# Patient Record
Sex: Female | Born: 1970 | Race: White | Hispanic: No | Marital: Married | Smoking: Former smoker
Health system: Southern US, Community
[De-identification: ages and names within clinical notes are randomized; demographics above are authoritative.]

## PROBLEM LIST (undated history)

## (undated) DIAGNOSIS — G43701 Chronic migraine without aura, not intractable, with status migrainosus: Secondary | ICD-10-CM

## (undated) HISTORY — DX: Chronic migraine without aura, not intractable, with status migrainosus: G43.701

## (undated) HISTORY — PX: TUBAL LIGATION: SHX77

---

## 1997-05-29 ENCOUNTER — Other Ambulatory Visit: Admission: RE | Admit: 1997-05-29 | Discharge: 1997-05-29 | Payer: Self-pay | Admitting: Obstetrics and Gynecology

## 1999-02-12 ENCOUNTER — Observation Stay (HOSPITAL_COMMUNITY): Admission: EM | Admit: 1999-02-12 | Discharge: 1999-02-13 | Payer: Self-pay | Admitting: Internal Medicine

## 1999-08-31 ENCOUNTER — Other Ambulatory Visit: Admission: RE | Admit: 1999-08-31 | Discharge: 1999-08-31 | Payer: Self-pay | Admitting: Obstetrics and Gynecology

## 2000-03-23 ENCOUNTER — Other Ambulatory Visit: Admission: RE | Admit: 2000-03-23 | Discharge: 2000-03-23 | Payer: Self-pay | Admitting: Obstetrics and Gynecology

## 2000-03-25 ENCOUNTER — Emergency Department (HOSPITAL_COMMUNITY): Admission: EM | Admit: 2000-03-25 | Discharge: 2000-03-25 | Payer: Self-pay | Admitting: Emergency Medicine

## 2000-04-03 ENCOUNTER — Emergency Department (HOSPITAL_COMMUNITY): Admission: EM | Admit: 2000-04-03 | Discharge: 2000-04-03 | Payer: Self-pay | Admitting: Emergency Medicine

## 2001-03-27 ENCOUNTER — Other Ambulatory Visit: Admission: RE | Admit: 2001-03-27 | Discharge: 2001-03-27 | Payer: Self-pay | Admitting: Obstetrics and Gynecology

## 2002-05-31 ENCOUNTER — Ambulatory Visit (HOSPITAL_COMMUNITY): Admission: RE | Admit: 2002-05-31 | Discharge: 2002-05-31 | Payer: Self-pay | Admitting: Obstetrics and Gynecology

## 2004-08-16 ENCOUNTER — Ambulatory Visit: Payer: Self-pay | Admitting: Internal Medicine

## 2011-11-07 ENCOUNTER — Other Ambulatory Visit (HOSPITAL_BASED_OUTPATIENT_CLINIC_OR_DEPARTMENT_OTHER): Payer: Self-pay | Admitting: Obstetrics and Gynecology

## 2011-11-07 DIAGNOSIS — Z1231 Encounter for screening mammogram for malignant neoplasm of breast: Secondary | ICD-10-CM

## 2011-11-09 ENCOUNTER — Ambulatory Visit (HOSPITAL_BASED_OUTPATIENT_CLINIC_OR_DEPARTMENT_OTHER)
Admission: RE | Admit: 2011-11-09 | Discharge: 2011-11-09 | Disposition: A | Discharge status: Home / Self Care | Payer: Private Health Insurance - Indemnity | Source: Ambulatory Visit | Attending: Obstetrics and Gynecology | Admitting: Obstetrics and Gynecology

## 2011-11-09 DIAGNOSIS — R922 Inconclusive mammogram: Secondary | ICD-10-CM | POA: Insufficient documentation

## 2011-11-09 DIAGNOSIS — Z1231 Encounter for screening mammogram for malignant neoplasm of breast: Secondary | ICD-10-CM | POA: Insufficient documentation

## 2011-11-14 ENCOUNTER — Other Ambulatory Visit: Payer: Self-pay | Admitting: Obstetrics and Gynecology

## 2011-11-14 DIAGNOSIS — R928 Other abnormal and inconclusive findings on diagnostic imaging of breast: Secondary | ICD-10-CM

## 2011-11-15 ENCOUNTER — Ambulatory Visit
Admission: RE | Admit: 2011-11-15 | Discharge: 2011-11-15 | Disposition: A | Discharge status: Home / Self Care | Payer: Private Health Insurance - Indemnity | Source: Ambulatory Visit | Attending: Obstetrics and Gynecology | Admitting: Obstetrics and Gynecology

## 2011-11-15 DIAGNOSIS — R928 Other abnormal and inconclusive findings on diagnostic imaging of breast: Secondary | ICD-10-CM

## 2016-11-15 ENCOUNTER — Ambulatory Visit (INDEPENDENT_AMBULATORY_CARE_PROVIDER_SITE_OTHER): Payer: Managed Care, Other (non HMO) | Admitting: Neurology

## 2016-11-15 ENCOUNTER — Encounter (INDEPENDENT_AMBULATORY_CARE_PROVIDER_SITE_OTHER): Payer: Self-pay

## 2016-11-15 ENCOUNTER — Encounter: Payer: Self-pay | Admitting: Neurology

## 2016-11-15 VITALS — BP 96/53 | HR 68 | Ht 64.0 in | Wt 128.0 lb

## 2016-11-15 DIAGNOSIS — M542 Cervicalgia: Secondary | ICD-10-CM

## 2016-11-15 DIAGNOSIS — G43701 Chronic migraine without aura, not intractable, with status migrainosus: Secondary | ICD-10-CM | POA: Diagnosis not present

## 2016-11-15 DIAGNOSIS — H539 Unspecified visual disturbance: Secondary | ICD-10-CM

## 2016-11-15 DIAGNOSIS — R51 Headache with orthostatic component, not elsewhere classified: Secondary | ICD-10-CM

## 2016-11-15 DIAGNOSIS — R29818 Other symptoms and signs involving the nervous system: Secondary | ICD-10-CM | POA: Diagnosis not present

## 2016-11-15 DIAGNOSIS — R519 Headache, unspecified: Secondary | ICD-10-CM

## 2016-11-15 DIAGNOSIS — G4483 Primary cough headache: Secondary | ICD-10-CM

## 2016-11-15 DIAGNOSIS — R29898 Other symptoms and signs involving the musculoskeletal system: Secondary | ICD-10-CM | POA: Diagnosis not present

## 2016-11-15 DIAGNOSIS — S161XXA Strain of muscle, fascia and tendon at neck level, initial encounter: Secondary | ICD-10-CM

## 2016-11-15 DIAGNOSIS — G43009 Migraine without aura, not intractable, without status migrainosus: Secondary | ICD-10-CM | POA: Insufficient documentation

## 2016-11-15 MED ORDER — TOPIRAMATE 25 MG PO TABS: 50.0000 mg | ORAL_TABLET | Freq: Two times a day (BID) | ORAL | 6 refills | Status: DC

## 2016-11-15 MED ORDER — TIZANIDINE HCL 4 MG PO CAPS: 4.0000 mg | ORAL_CAPSULE | Freq: Every evening | ORAL | 11 refills | Status: DC | PRN

## 2016-11-15 MED ORDER — DICLOFENAC POTASSIUM(MIGRAINE) 50 MG PO PACK: PACK | ORAL | 11 refills | Status: DC

## 2016-11-15 MED ORDER — SUMATRIPTAN SUCCINATE 100 MG PO TABS: ORAL_TABLET | ORAL | 11 refills | Status: DC

## 2016-11-15 NOTE — Patient Instructions (Signed)
Remember to drink plenty of fluid, eat healthy meals and do not skip any meals. Try to eat protein with a every meal and eat a healthy snack such as fruit or nuts in between meals. Try to keep a regular sleep-wake schedule and try to exercise daily, particularly in the form of walking, 20-30 minutes a day, if you can.   As far as your medications are concerned, I would like to suggest:  Start Topiramate, Tizanidine at bedtime for neck spasms, With the imitrex take Cambia PT for neck spasms and dry needling Talk to OBGYn about birth control which may help As far as diagnostic testing: MRI brain and cervical spine  I would like to see you back in 4 months, sooner if we need to. Please call us with any interim questions, concerns, problems, updates or refill requests.   Our phone number is [phone]. We also have an after hours call service for urgent matters and there is a physician on-call for urgent questions. For any emergencies you know to call 911 or go to the nearest emergency room  Topiramate tablets What is this medicine? TOPIRAMATE (toe PYRE a mate) is used to treat seizures in adults or children with epilepsy. It is also used for the prevention of migraine headaches. This medicine may be used for other purposes; ask your health care provider or pharmacist if you have questions. COMMON BRAND NAME(S): Topamax, Topiragen What should I tell my health care provider before I take this medicine? They need to know if you have any of these conditions: -bleeding disorders -cirrhosis of the liver or liver disease -diarrhea -glaucoma -kidney stones or kidney disease -low blood counts, like low white cell, platelet, or red cell counts -lung disease like asthma, obstructive pulmonary disease, emphysema -metabolic acidosis -on a ketogenic diet -schedule for surgery or a procedure -suicidal thoughts, plans, or attempt; a previous suicide attempt by you or a family member -an unusual or  allergic reaction to topiramate, other medicines, foods, dyes, or preservatives -pregnant or trying to get pregnant -breast-feeding How should I use this medicine? Take this medicine by mouth with a glass of water. Follow the directions on the prescription label. Do not crush or chew. You may take this medicine with meals. Take your medicine at regular intervals. Do not take it more often than directed. Talk to your pediatrician regarding the use of this medicine in children. Special care may be needed. While this drug may be prescribed for children as young as 53 years of age for selected conditions, precautions do apply. Overdosage: If you think you have taken too much of this medicine contact a poison control center or emergency room at once. NOTE: This medicine is only for you. Do not share this medicine with others. What if I miss a dose? If you miss a dose, take it as soon as you can. If your next dose is to be taken in less than 6 hours, then do not take the missed dose. Take the next dose at your regular time. Do not take double or extra doses. What may interact with this medicine? Do not take this medicine with any of the following medications: -probenecid This medicine may also interact with the following medications: -acetazolamide -alcohol -amitriptyline -aspirin and aspirin-like medicines -birth control pills -certain medicines for depression -certain medicines for seizures -certain medicines that treat or prevent blood clots like warfarin, enoxaparin, dalteparin, apixaban, dabigatran, and rivaroxaban -digoxin -hydrochlorothiazide -lithium -medicines for pain, sleep, or muscle relaxation -metformin -methazolamide -  NSAIDS, medicines for pain and inflammation, like ibuprofen or naproxen -pioglitazone -risperidone This list may not describe all possible interactions. Give your health care provider a list of all the medicines, herbs, non-prescription drugs, or dietary  supplements you use. Also tell them if you smoke, drink alcohol, or use illegal drugs. Some items may interact with your medicine. What should I watch for while using this medicine? Visit your doctor or health care professional for regular checks on your progress. Do not stop taking this medicine suddenly. This increases the risk of seizures if you are using this medicine to control epilepsy. Wear a medical identification bracelet or chain to say you have epilepsy or seizures, and carry a card that lists all your medicines. This medicine can decrease sweating and increase your body temperature. Watch for signs of deceased sweating or fever, especially in children. Avoid extreme heat, hot baths, and saunas. Be careful about exercising, especially in hot weather. Contact your health care provider right away if you notice a fever or decrease in sweating. You should drink plenty of fluids while taking this medicine. If you have had kidney stones in the past, this will help to reduce your chances of forming kidney stones. If you have stomach pain, with nausea or vomiting and yellowing of your eyes or skin, call your doctor immediately. You may get drowsy, dizzy, or have blurred vision. Do not drive, use machinery, or do anything that needs mental alertness until you know how this medicine affects you. To reduce dizziness, do not sit or stand up quickly, especially if you are an older patient. Alcohol can increase drowsiness and dizziness. Avoid alcoholic drinks. If you notice blurred vision, eye pain, or other eye problems, seek medical attention at once for an eye exam. The use of this medicine may increase the chance of suicidal thoughts or actions. Pay special attention to how you are responding while on this medicine. Any worsening of mood, or thoughts of suicide or dying should be reported to your health care professional right away. This medicine may increase the chance of developing metabolic acidosis. If  left untreated, this can cause kidney stones, bone disease, or slowed growth in children. Symptoms include breathing fast, fatigue, loss of appetite, irregular heartbeat, or loss of consciousness. Call your doctor immediately if you experience any of these side effects. Also, tell your doctor about any surgery you plan on having while taking this medicine since this may increase your risk for metabolic acidosis. Birth control pills may not work properly while you are taking this medicine. Talk to your doctor about using an extra method of birth control. Women who become pregnant while using this medicine may enroll in the [place] American Antiepileptic Drug Pregnancy Registry by calling [phone]. This registry collects information about the safety of antiepileptic drug use during pregnancy. What side effects may I notice from receiving this medicine? Side effects that you should report to your doctor or health care professional as soon as possible: -allergic reactions like skin rash, itching or hives, swelling of the face, lips, or tongue -decreased sweating and/or rise in body temperature -depression -difficulty breathing, fast or irregular breathing patterns -difficulty speaking -difficulty walking or controlling muscle movements -hearing impairment -redness, blistering, peeling or loosening of the skin, including inside the mouth -tingling, pain or numbness in the hands or feet -unusual bleeding or bruising -unusually weak or tired -worsening of mood, thoughts or actions of suicide or dying Side effects that usually do not require medical attention (report  to your doctor or health care professional if they continue or are bothersome): -altered taste -back pain, joint or muscle aches and pains -diarrhea, or constipation -headache -loss of appetite -nausea -stomach upset, indigestion -tremors This list may not describe all possible side effects. Call your doctor for medical advice  about side effects. You may report side effects to FDA at 1-800-FDA-1088. Where should I keep my medicine? Keep out of the reach of children. Store at room temperature between 15 and 30 degrees C (59 and 86 degrees F) in a tightly closed container. Protect from moisture. Throw away any unused medicine after the expiration date. NOTE: This sheet is a summary. It may not cover all possible information. If you have questions about this medicine, talk to your doctor, pharmacist, or health care provider.  2018 Elsevier/Gold Standard (2013-02-18 23:17:57)  Diclofenac powder for oral solution What is this medicine? DICLOFENAC (dye KLOE fen ak) is a non-steroidal anti-inflammatory drug (NSAID). It is used to treat migraine pain. This medicine may be used for other purposes; ask your health care provider or pharmacist if you have questions. COMMON BRAND NAME(S): Cambia What should I tell my health care provider before I take this medicine? They need to know if you have any of these conditions: -asthma, especially aspirin sensitive asthma -coronary artery bypass graft (CABG) surgery within the past 2 weeks -drink more than 3 alcohol-containing drinks a day -heart disease or circulation problems like heart failure or leg edema (fluid retention) -high blood pressure -kidney disease -liver disease -phenylketonuria -stomach problems -an unusual or allergic reaction to diclofenac, aspirin, other NSAIDs, other medicines, foods, dyes, or preservatives -pregnant or trying to get pregnant -breast-feeding How should I use this medicine? Mix this medicine with 1 to 2 ounces of water. Drink the medicine and water together. Follow the directions on the prescription label. Do not take your medicine more often than directed. Long-term, continuous use may increase the risk of heart attack or stroke. A special MedGuide will be given to you by the pharmacist with each prescription and refill. Be sure to read this  information carefully each time. Talk to your pediatrician regarding the use of this medicine in children. Special care may be needed. Elderly patients over 48 years old may have a stronger reaction and need a smaller dose. Overdosage: If you think you have taken too much of this medicine contact a poison control center or emergency room at once. NOTE: This medicine is only for you. Do not share this medicine with others. What if I miss a dose? This does not apply. What may interact with this medicine? Do not take this medicine with any of the following medications: -cidofovir -ketorolac -methotrexate This medicine may also interact with the following medications: -alcohol -aspirin and aspirin-like medicines -cyclosporine -diuretics -lithium -medicines for blood pressure -medicines for osteoporosis -medicines that affect platelets -medicines that treat or prevent blood clots like warfarin -NSAIDs, medicines for pain and inflammation, like ibuprofen or naproxen -pemetrexed -steroid medicines like prednisone or cortisone This list may not describe all possible interactions. Give your health care provider a list of all the medicines, herbs, non-prescription drugs, or dietary supplements you use. Also tell them if you smoke, drink alcohol, or use illegal drugs. Some items may interact with your medicine. What should I watch for while using this medicine? Tell your doctor or health care professional if your pain does not get better. Talk to your doctor before taking another medicine for pain. Do not treat yourself.  This medicine does not prevent heart attack or stroke. In fact, this medicine may increase the chance of a heart attack or stroke. The chance may increase with longer use of this medicine and in people who have heart disease. If you take aspirin to prevent heart attack or stroke, talk with your doctor or health care professional. Do not take medicines such as ibuprofen and naproxen  with this medicine. Side effects such as stomach upset, nausea, or ulcers may be more likely to occur. Many medicines available without a prescription should not be taken with this medicine. This medicine can cause ulcers and bleeding in the stomach and intestines at any time during treatment. Do not smoke cigarettes or drink alcohol. These increase irritation to your stomach and can make it more susceptible to damage from this medicine. Ulcers and bleeding can happen without warning symptoms and can cause death. You may get drowsy or dizzy. Do not drive, use machinery, or do anything that needs mental alertness until you know how this medicine affects you. Do not stand or sit up quickly, especially if you are an older patient. This reduces the risk of dizzy or fainting spells. This medicine can cause you to bleed more easily. Try to avoid damage to your teeth and gums when you brush or floss your teeth. If you take migraine medicines for 10 or more days a month, your migraines may get worse. Keep a diary of headache days and medicine use. Contact your healthcare professional if your migraine attacks occur more frequently. What side effects may I notice from receiving this medicine? Side effects that you should report to your doctor or health care professional as soon as possible: -allergic reactions like skin rash, itching or hives, swelling of the face, lips, or tongue -black or bloody stools, blood in the urine or vomit -blurred vision -chest pain -difficulty breathing or wheezing -nausea or vomiting -fever -redness, blistering, peeling or loosening of the skin, including inside the mouth -slurred speech or weakness on one side of the body -trouble passing urine or change in the amount of urine -unexplained weight gain or swelling -unusually weak or tired -yellowing of eyes or skin Side effects that usually do not require medical attention (report to your doctor or health care professional if  they continue or are bothersome): -constipation -diarrhea -dizziness -headache -heartburn This list may not describe all possible side effects. Call your doctor for medical advice about side effects. You may report side effects to FDA at 1-800-FDA-1088. Where should I keep my medicine? Keep out of the reach of children. Store at room temperature between 15 and 30 degrees C (59 and 86 degrees F). Throw away any unused medicine after the expiration date. NOTE: This sheet is a summary. It may not cover all possible information. If you have questions about this medicine, talk to your doctor, pharmacist, or health care provider.  2018 Elsevier/Gold Standard (2015-03-19 09:56:49)

## 2016-11-15 NOTE — Progress Notes (Signed)
[facility]    Provider:  Dr Lucia Gaskins Referring Provider: Richardean Chimera, MD Primary Care Physician:  Richardean Chimera, MD  CC:  Migraines and neck pain  HPI:  Carol Mckinney is a 46 y.o. female here as a referral from Dr. Arelia Sneddon for migraines and neck pain. Started years ago and sporadic, once every 6 months. In the last year she has a cluster, lasts 4-5 days. Maybe around periods, unclear because her periods are not regular may may premenopausal. She has 16 headache days a month and 8 headache days a month that are migraines. Every month cluster of 4-5 days of refractory migraines. Migraines are on the left, the most uncomfortable pain, skin sensitivity around the left eye, pulsating, unilateral, light sensitivity, nausea. She takes imitrex but it take 90 minutes to work. Imitrex works, it takes time. No other OTC medications. No aura. No medication overuse. Worsening over the last year. The ramp up is slow. She has neck pain, started in June/July, stiffness, decreased ROM and the right arm hurts. She has muscle tightness in the cervical paraspinals. If she sneezes the headache worsens and the neck pain worsens. Headache is positional. Never had imaging of her brain ir neck. Imitrex makes her head feel heavy and foggy. >!5 headache days a month.No other focal neurologic deficits, associated symptoms, inciting events or modifiable factors.  Meds tried: Prozac, starting topiramate   Reviewed notes, labs and imaging from outside physicians, which showed:  Reviewed notes, her cycles are every 2-3 weeks and to 3 days of flow, not heavy, some distress and Aredia, sexual activity with no discomfort, previous bilateral tubal ligations. Has had problems with migraine headaches seem to cluster around her periods. Does have some mild stress incontinence. No change in bowel habits. May be perimenopausal anovulatory cycling. Exam was negative. Diagnosed with migraine headaches.  CMP normal  09/2014  Review of Systems: Patient complains of symptoms per HPI as well as the following symptoms: Headache, insomnia, snoring, joint pain. Pertinent negatives and positives per HPI. All others negative.   Social History   Social History  . Marital status: Married    Spouse name: N/A  . Number of children: N/A  . Years of education: N/A   Occupational History  . Not on file.   Social History Main Topics  . Smoking status: Current Every Day Smoker    Types: Cigarettes  . Smokeless tobacco: Never Used     Comment: 5 cig a day  . Alcohol use No  . Drug use: No  . Sexual activity: Not on file   Other Topics Concern  . Not on file   Social History Narrative  . No narrative on file    Family History  Problem Relation Age of Onset  . Migraines Mother     Past Medical History:  Diagnosis Date  . Chronic migraine without aura, with status migrainosus     Past Surgical History:  Procedure Laterality Date  . no surgical history      Current Outpatient Prescriptions  Medication Sig Dispense Refill  . SUMAtriptan (IMITREX) 100 MG tablet take one tablet at the onset of your headache. If it does not improve the symptoms take one additional tablet in 2 hours. Max 2 in 24hrs. 10 tablet 11  . Diclofenac Potassium 50 MG PACK Take 50 mg by mouth once as needed for headache onset. May take with imitrex. 9 each 11  . tiZANidine (ZANAFLEX) 4 MG capsule Take 1 capsule (4 mg total)  by mouth at bedtime as needed for muscle spasms. 30 capsule 11  . topiramate (TOPAMAX) 25 MG tablet Take 2 tablets (50 mg total) by mouth 2 (two) times daily. 60 tablet 6   No current facility-administered medications for this visit.     Allergies as of 11/15/2016  . (Not on File)    Vitals: BP (!) 96/53   Pulse 68   Ht  (1.626 m)   Wt 128 lb (58.1 kg)   BMI 21.97 kg/m  Last Weight:  Wt Readings from Last 1 Encounters:  11/15/16 128 lb (58.1 kg)   Last Height:   Ht Readings from Last  1 Encounters:  11/15/16  (1.626 m)    Physical exam: Exam: Gen: NAD, conversant, well nourised, obese, well groomed                     CV: RRR, no MRG. No Carotid Bruits. No peripheral edema, warm, nontender Eyes: Conjunctivae clear without exudates or hemorrhage  Neuro: Detailed Neurologic Exam  Speech:    Speech is normal; fluent and spontaneous with normal comprehension.  Cognition:    The patient is oriented to person, place, and time;     recent and remote memory intact;     language fluent;     normal attention, concentration,     fund of knowledge Cranial Nerves:    The pupils are equal, round, and reactive to light. The fundi are normal and spontaneous venous pulsations are present. Visual fields are full to finger confrontation. Extraocular movements are intact. Trigeminal sensation is intact and the muscles of mastication are normal. The face is symmetric. The palate elevates in the midline. Hearing intact. Voice is normal. Shoulder shrug is normal. The tongue has normal motion without fasciculations.   Coordination:    Normal finger to nose and heel to shin. Normal rapid alternating movements.   Gait:    Heel-toe and tandem gait are normal.   Motor Observation:    No asymmetry, no atrophy, and no involuntary movements noted. Tone:    Normal muscle tone.    Posture:    Posture is normal. normal erect    Strength: Mild right arm weakness, otherwise strength is V/V in the upper and lower limbs.      Sensation: intact to LT     Reflex Exam:  DTR's:    Deep tendon reflexes in the upper and lower extremities are normal bilaterally.   Toes:    The toes are downgoing bilaterally.   Clonus:    Clonus is absent.      Assessment/Plan:  46 year old patient with chronic migraines without aura. Had a very long discussion about possible migraine management including acute, chronic, preventative and nonpharmacologic methods.  Remember to drink plenty of fluid,  eat healthy meals and do not skip any meals. Try to eat protein with a every meal and eat a healthy snack such as fruit or nuts in between meals. Try to keep a regular sleep-wake schedule and try to exercise daily, particularly in the form of walking, 20-30 minutes a day, if you can.   As far as your medications are concerned, I would like to suggest:  For migraine preventative: Start Topiramate, discussed botox therapy and patient can contact me in the next 4 months, will titrate Topiramate and/or try another preventative but patient may be best on Botox therapy given her sensitivity to medications and failure several classes already. Muscle spasms Tizanidine at bedtime for  neck spasms,   Physical Therapy: Cervical myofascial pain, forward posture contributing to migraines and cervicalgia. Please evaluate and treat including dry needling, stretching, strengthening, manual therapy/massage, heating, TENS unit, exercising for scapular stabilization, pectoral stretching and rhomboid strengthening as clinically warranted as well as any other modality as recommended by evaluation.  Migraine acute management: With the imitrex take Cambia (has tried Tylenol, Advil, Aleve, diclofenac tab, Toradol)  Hormonal fluctuations of migraine: Talk to OBGYn about birth control which may help As far as diagnostic testing: MRI brain given worsening headaches, pain with cough or Valsalva to rule out space-occupying mass or lesion causing positional changes,   I would like to see you back in 4 months, sooner if we need to. Please call us with any interim questions, concerns, problems, updates or refill requests.   Orders Placed This Encounter  Procedures  . MR BRAIN W WO CONTRAST  . Basic Metabolic Panel  . Ambulatory referral to Physical Therapy    Discussed: To prevent or relieve headaches, try the following: Cool Compress. Lie down and place a cool compress on your head.  Avoid headache triggers. If certain  foods or odors seem to have triggered your migraines in the past, avoid them. A headache diary might help you identify triggers.  Include physical activity in your daily routine. Try a daily walk or other moderate aerobic exercise.  Manage stress. Find healthy ways to cope with the stressors, such as delegating tasks on your to-do list.  Practice relaxation techniques. Try deep breathing, yoga, massage and visualization.  Eat regularly. Eating regularly scheduled meals and maintaining a healthy diet might help prevent headaches. Also, drink plenty of fluids.  Follow a regular sleep schedule. Sleep deprivation might contribute to headaches Consider biofeedback. With this mind-body technique, you learn to control certain bodily functions - such as muscle tension, heart rate and blood pressure - to prevent headaches or reduce headache pain.    Proceed to emergency room if you experience new or worsening symptoms or symptoms do not resolve, if you have new neurologic symptoms or if headache is severe, or for any concerning symptom.   Provided education and documentation from American headache Society toolbox including articles on: chronic migraine medication overuse headache, chronic migraines, prevention of migraines, behavioral and other nonpharmacologic treatments for headache.    Naomie Dean, MD  [facility] [address] [place], [place] [address]  Phone [phone] Fax [phone]

## 2016-11-22 ENCOUNTER — Ambulatory Visit (INDEPENDENT_AMBULATORY_CARE_PROVIDER_SITE_OTHER): Payer: Managed Care, Other (non HMO)

## 2016-11-22 DIAGNOSIS — R29818 Other symptoms and signs involving the nervous system: Secondary | ICD-10-CM

## 2016-11-22 DIAGNOSIS — H539 Unspecified visual disturbance: Secondary | ICD-10-CM

## 2016-11-22 DIAGNOSIS — R519 Headache, unspecified: Secondary | ICD-10-CM

## 2016-11-22 DIAGNOSIS — G4483 Primary cough headache: Secondary | ICD-10-CM | POA: Diagnosis not present

## 2016-11-22 DIAGNOSIS — R29898 Other symptoms and signs involving the musculoskeletal system: Secondary | ICD-10-CM

## 2016-11-22 DIAGNOSIS — R51 Headache with orthostatic component, not elsewhere classified: Secondary | ICD-10-CM

## 2016-11-22 MED ORDER — GADOPENTETATE DIMEGLUMINE 469.01 MG/ML IV SOLN: 11.0000 mL | Freq: Once | INTRAVENOUS | Status: AC | PRN

## 2016-11-25 ENCOUNTER — Ambulatory Visit: Payer: Managed Care, Other (non HMO) | Admitting: Physical Therapy

## 2016-11-29 ENCOUNTER — Ambulatory Visit: Payer: Managed Care, Other (non HMO) | Admitting: Physical Therapy

## 2017-03-20 ENCOUNTER — Encounter: Payer: Self-pay | Admitting: Neurology

## 2017-03-20 ENCOUNTER — Encounter (INDEPENDENT_AMBULATORY_CARE_PROVIDER_SITE_OTHER): Payer: Self-pay

## 2017-03-20 ENCOUNTER — Ambulatory Visit: Payer: Managed Care, Other (non HMO) | Admitting: Neurology

## 2017-03-20 VITALS — BP 94/57 | HR 68 | Ht 64.0 in | Wt 130.0 lb

## 2017-03-20 DIAGNOSIS — G43709 Chronic migraine without aura, not intractable, without status migrainosus: Secondary | ICD-10-CM

## 2017-03-20 MED ORDER — FLUOXETINE HCL 20 MG PO CAPS: 20.0000 mg | ORAL_CAPSULE | Freq: Every day | ORAL | 4 refills | Status: DC

## 2017-03-20 NOTE — Progress Notes (Addendum)
[facility]    Provider:  Dr Lucia Gaskins Referring Provider: Richardean Chimera, MD Primary Care Physician:  Richardean Chimera, MD  CC:  Migraines and neck pain  Interval history: Migraines are worsening.  Topiramate samples had side effects.  Ws on prozac in the past.  B-blockers contraindicated.  No medication overuse and no aura.  She has 20 headache days a month and 15 her migraines this is been ongoing for over 4 months.  She has been on multiple medications in the past including Prozac, we gave her multiple samples last time of topiramate and patient had significant cognitive impairment, will restart Prozac at this time, beta-blockers are contraindicated due to very low blood pressure.  Recommend Botox at this time. Migraines can last up to 24 hours and be severe. Cambia made her throw up.  HPI:  Carol Mckinney is a 47 y.o. female here as a referral from Dr. Arelia Sneddon for migraines and neck pain. Started years ago and sporadic, once every 6 months. In the last year she has a cluster, lasts 4-5 days. Maybe around periods, unclear because her periods are not regular may may premenopausal. She has 16 headache days a month and 8 headache days a month that are migraines. Every month cluster of 4-5 days of refractory migraines. Migraines are on the left, the most uncomfortable pain, skin sensitivity around the left eye, pulsating, unilateral, light sensitivity, nausea. She takes imitrex but it take 90 minutes to work. Imitrex works, it takes time. No other OTC medications. No aura. No medication overuse. Worsening over the last year. The ramp up is slow. She has neck pain, started in June/July, stiffness, decreased ROM and the right arm hurts. She has muscle tightness in the cervical paraspinals. If she sneezes the headache worsens and the neck pain worsens. Headache is positional. Never had imaging of her brain ir neck. Imitrex makes her head feel heavy and foggy. >!5 headache days a month.No other  focal neurologic deficits, associated symptoms, inciting events or modifiable factors.  Meds tried: Prozac, topiramate(side effects), Maxalt (horrible), Imitrex (works but makes her groggy), prozac, b-blockers contraindicated due to low BP  Reviewed notes, labs and imaging from outside physicians, which showed:  Reviewed notes, her cycles are every 2-3 weeks and to 3 days of flow, not heavy, some distress and Aredia, sexual activity with no discomfort, previous bilateral tubal ligations. Has had problems with migraine headaches seem to cluster around her periods. Does have some mild stress incontinence. No change in bowel habits. May be perimenopausal anovulatory cycling. Exam was negative. Diagnosed with migraine headaches.  CMP normal 09/2014   Social History   Socioeconomic History  . Marital status: Married    Spouse name: Not on file  . Number of children: 2  . Years of education: Not on file  . Highest education level: Some college, no degree  Social Needs  . Financial resource strain: Not on file  . Food insecurity - worry: Not on file  . Food insecurity - inability: Not on file  . Transportation needs - medical: Not on file  . Transportation needs - non-medical: Not on file  Occupational History  . Occupation: Land    Comment: [employer]  Tobacco Use  . Smoking status: Former Smoker    Types: Cigarettes    Last attempt to quit: 02/28/2017    Years since quitting: 0.0  . Smokeless tobacco: Never Used  . Tobacco comment: 5 cig a day  Substance and Sexual Activity  .  Alcohol use: No  . Drug use: No  . Sexual activity: Not on file  Other Topics Concern  . Not on file  Social History Narrative   Lives at home with husband and daughters   Right handed   Drinks no caffeine daily    Family History  Problem Relation Age of Onset  . Migraines Mother     Past Medical History:  Diagnosis Date  . Chronic migraine without aura, with status  migrainosus     Past Surgical History:  Procedure Laterality Date  . no surgical history      Current Outpatient Medications  Medication Sig Dispense Refill  . Diclofenac Potassium 50 MG PACK Take 50 mg by mouth once as needed for headache onset. May take with imitrex. 9 each 11  . SUMAtriptan (IMITREX) 100 MG tablet take one tablet at the onset of your headache. If it does not improve the symptoms take one additional tablet in 2 hours. Max 2 in 24hrs. 10 tablet 11  . tiZANidine (ZANAFLEX) 4 MG capsule Take 1 capsule (4 mg total) by mouth at bedtime as needed for muscle spasms. 30 capsule 11  . topiramate (TOPAMAX) 25 MG tablet Take 2 tablets (50 mg total) by mouth 2 (two) times daily. 60 tablet 6  . FLUoxetine (PROZAC) 20 MG capsule Take 1 capsule (20 mg total) by mouth daily. 90 capsule 4   No current facility-administered medications for this visit.    Facility-Administered Medications Ordered in Other Visits  Medication Dose Route Frequency Provider Last Rate Last Dose  . gadopentetate dimeglumine (MAGNEVIST) injection 11 mL  11 mL Intravenous Once PRN Anson Fret, MD        Allergies as of 03/20/2017  . (Not on File)    Vitals: BP (!) 94/57 (BP Location: Right Arm, Patient Position: Sitting)   Pulse 68   Ht 5\' 4"  (1.626 m)   Wt 130 lb (59 kg)   BMI 22.31 kg/m  Last Weight:  Wt Readings from Last 1 Encounters:  03/20/17 130 lb (59 kg)   Last Height:   Ht Readings from Last 1 Encounters:  03/20/17 5\' 4"  (1.626 m)    Physical exam: Exam: Gen: NAD, conversant, well nourised, well groomed                      Neuro: Detailed Neurologic Exam  Speech:    Speech is normal; fluent and spontaneous with normal comprehension.  Cognition:    The patient is oriented to person, place, and time;     recent and remote memory intact;     language fluent;     normal attention, concentration,     fund of knowledge Cranial Nerves:    The pupils are equal, round, and  reactive to light. The fundi are normal and spontaneous venous pulsations are present. Visual fields are full to finger confrontation. Extraocular movements are intact. Trigeminal sensation is intact and the muscles of mastication are normal. The face is symmetric. The palate elevates in the midline. Hearing intact. Voice is normal. Shoulder shrug is normal. The tongue has normal motion without fasciculations.   Coordination:    Normal finger to nose and heel to shin. Normal rapid alternating movements.   Gait:    Heel-toe and tandem gait are normal.   Motor Observation:    No asymmetry, no atrophy, and no involuntary movements noted. Tone:    Normal muscle tone.    Posture:  Posture is normal. normal erect    Strength:    Strength is V/V in the upper and lower limbs.      Sensation: intact to LT     Reflex Exam:    Assessment/Plan:  47 year old patient with chronic migraines without aura for at least one year, worsening, failed multiple classes medications. No medication overuse and no aura.  She has 20 headache days a month and 15 are migraines this is been ongoing for over 4 months at this higher frequency.    - She has been on multiple medications in the past including Prozac, we gave her multiple samples last time of topiramate and patient had significant cognitive impairment, will restart Prozac at this time, beta-blockers are contraindicated due to very low blood pressure.  Recommend Botox at this time. Tried PT did not help.    Migraine acute management: With the imitrex (has tried Tylenol, Advil, Aleve, diclofenac tab, Toradol)  Hormonal fluctuations of migraine: Working with OBGY[facility] about birth control which may help  I would like to see you back in for botox   Naomie DeanAntonia Ahern, MD  [facility] [address] [place], [place] [id number]  Phone [phone] Fax [phone]  A total of 15 minutes was spent face-to-face with this  patient. Over half this time was spent on counseling patient on the chronic migraine diagnosis and different diagnostic and therapeutic options available.

## 2017-04-13 ENCOUNTER — Ambulatory Visit: Payer: Managed Care, Other (non HMO) | Admitting: Neurology

## 2017-04-24 ENCOUNTER — Telehealth: Payer: Self-pay | Admitting: Neurology

## 2017-04-24 NOTE — Telephone Encounter (Signed)
Patient called and relayed she received a letter in the mail from her insurance company relaying her Botox request is denied at this time.   I relayed to Patient Carol Mckinney is out of the office 04/24/2017.

## 2017-04-25 ENCOUNTER — Ambulatory Visit: Payer: Self-pay | Admitting: Neurology

## 2017-04-25 NOTE — Telephone Encounter (Signed)
I called and went over the denial with the patient. She did not get approved because she has not tried and failed enough drug classes. She said she is willing to try another medication in order to get the botox approved but would like to know what Dr. Lucia GaskinsAhern thinks she should do. Please call and advise.

## 2017-05-01 NOTE — Telephone Encounter (Signed)
Is there any way we can call insurance and see what medications she has to try first? Did they send a detailed denial letter? In the meantime we could offer he Ajovy or Aimovig.   Danielle, anymore details on what meds she needs to try?

## 2017-05-04 NOTE — Telephone Encounter (Signed)
She has to have tried and failed a beta blocker, calcium channel blocker, tricyclic antidepressant or anticonvulsant. She needs two of these drug classes for at least 3 months.

## 2017-05-08 NOTE — Telephone Encounter (Signed)
Toma CopierBethany, would you call and discuss with patient we need to try her on more medication before botox can be approved unfortunately. See if she is willing to try something else. She has to be on it for at least 3 months, again unfortunately.

## 2017-05-08 NOTE — Telephone Encounter (Signed)
Spoke with patient. She is fine with trying another medication. She is aware that she will need to try it least 3 months. Pt prefers one that doesn't cause weight gain.

## 2017-05-10 ENCOUNTER — Other Ambulatory Visit: Payer: Self-pay | Admitting: Neurology

## 2017-05-10 DIAGNOSIS — G43711 Chronic migraine without aura, intractable, with status migrainosus: Secondary | ICD-10-CM

## 2017-05-10 MED ORDER — ZONISAMIDE 50 MG PO CAPS: 50.0000 mg | ORAL_CAPSULE | Freq: Every day | ORAL | 6 refills | Status: DC

## 2017-05-10 NOTE — Telephone Encounter (Signed)
Called pt and discussed that Dr. Lucia GaskinsAhern has ordered Zonegran 50 mg daily at bedtime. Discussed that this is a seizure medication and s/e including but not limited to dizziness, sleepiness, agitation or irritability, depression (low percentage). Pt encouraged to consult full list of side effects and information that comes with medication at time of pick up. She verbalized appreciation and understanding and will update Dr. Lucia GaskinsAhern.

## 2017-05-10 NOTE — Telephone Encounter (Signed)
Let's try zonegran 50mg  at bedtime. Will call it in thanks

## 2017-06-12 ENCOUNTER — Telehealth: Payer: Self-pay | Admitting: Neurology

## 2017-06-12 NOTE — Telephone Encounter (Signed)
Patient calling to discuss dosage of topiramate (TOPAMAX) 25 MG tablet.

## 2017-06-12 NOTE — Telephone Encounter (Signed)
Pt has returned the call to RN Bethany, please call °

## 2017-06-12 NOTE — Telephone Encounter (Signed)
Spoke with pt. She started Qudexy 25 mg (was given samples in January at office visit) last week to try it since she was having a migraine for 6 days. She stated that she had picked up the Zonisamide but has not started it yet. She is not taking Topamax. She would like Dr. Trevor MaceAhern's advice on what to take. She will take Qudexy 25 mg again tonight while she waits for clarification. She stated she got confused on what to take.

## 2017-06-12 NOTE — Telephone Encounter (Signed)
Attempted to return pt's call. Phone rang, voicemail box was full.

## 2017-06-13 NOTE — Telephone Encounter (Addendum)
Spoke with the patient and discussed her medications. RN informed pt that Dr. Lucia GaskinsAhern was told by the patient in January that Topiramate gave her significant cognitive impairment. The patient clarified and said that she had never filled the Topiramate. She was worried about potential cognitive side effects and had heard "other people's horror stories" so she never took it. She never started the Zonegran either but she did fill it. One week ago she started the Qudexy XR 25 mg samples that she was given. She is taking it at night. So far, she has less energy and feels drained and doesn't even have the energy to go go to the gym so she is concerned about increasing the dose. She is open to doing whatever Dr. Lucia GaskinsAhern suggests.   Meds tried: Qudexy (side effects), Prozac, Maxalt (horrible), Imitrex (works but makes her groggy), b-blockers contraindicated due to low BP.   Spoke with Dr. Lucia GaskinsAhern. Due to side effects of Qudexy, will have pt stop this and start Zonegran as previously prescribed. RN will also d/c Topiramate from med list.  Spoke with the patient. Advised her that since she is having side effects on the Qudexy, Dr. Lucia GaskinsAhern will have her stop that and start Zonegran 50 mg at bedtime. Pt already has this med at home. Discussed that this is a seizure medication and s/e including but not limited to dizziness, sleepiness, agitation or irritability, depression (low percentage). Pt encouraged to consult full list of side effects and information that comes with medication Patient verbalized understanding and appreciation. She will not take the Qudexy tonight and will start the Zonegran tonight. She RN encouraged pt to call with any questions.

## 2017-06-13 NOTE — Telephone Encounter (Signed)
Patient reported that the Topiramate gave her significant cognitive impairment so she stopped it. So no she isn;t supposed to be taking that. We then started the actazolamide which is what she should be taking not the topiramate. Tell her to throw away the topiramate samples. thanks

## 2017-06-13 NOTE — Addendum Note (Signed)
Addended by: Bertram SavinULBERTSON, Adora Yeh L on: 06/13/2017 11:06 AM   Modules accepted: Orders

## 2017-08-07 ENCOUNTER — Telehealth: Payer: Self-pay | Admitting: Neurology

## 2017-08-07 NOTE — Telephone Encounter (Signed)
Infusion availability at 1:00 or 1:30. Ok per Dr. Lucia GaskinsAhern.   Called pt to discuss. Voicemail box not setup. Unable to reach pt.

## 2017-08-07 NOTE — Telephone Encounter (Signed)
Pt's had a migraine for the past 8 days. She is wanting to come in for an injection. Please call to advise

## 2017-08-07 NOTE — Telephone Encounter (Addendum)
Spoke with patient. She stated that she is ok right now. She wants to know her options if this comes back. She has had intermittent migraine for 8 out of 10 days. She has taken her Imitrex. Afraid she will end up running out. Has f/u appt on 6/27. Had to stop Zonegran d/t side effects: tired, ?lower BP, could hardly walk. Discussed that pt could call back if her h/a comes back and possibly get an injection this afternoon like Toradol. Also if new symptoms arise or this happens after hours, advise ED or urgent care. Pt verbalized understanding and appreciation.

## 2017-08-08 NOTE — Telephone Encounter (Signed)
Tried to call pt. No answer and vm box has not been setup yet.

## 2017-08-08 NOTE — Telephone Encounter (Signed)
I owuld like to start her on Aimovig 140mg (aslong as no latex allergy). We discussed this at last appointment, if she is willing please order 140mg  once monthly and we can push back her appointment to give it time. These are the new meds and they are great

## 2017-08-14 ENCOUNTER — Other Ambulatory Visit: Payer: Self-pay | Admitting: Physician Assistant

## 2017-08-14 DIAGNOSIS — R1013 Epigastric pain: Secondary | ICD-10-CM

## 2017-08-17 NOTE — Telephone Encounter (Signed)
Okay to start aimovig.

## 2017-08-17 NOTE — Telephone Encounter (Signed)
I called pt, explained Dr. Trevor MaceAhern's recommendations. She is agreeable to starting aimovig. She denies a latex allergy. She [facility] [place] Pt knows an RN that can assist her with the monthly injection. Pt asked that her appt on 08/24/17 be cancelled and pushed out farther. Appt made for 10/11/17 at 3:00pm. Pt verbalized understanding of new appt date and time and recommendations.

## 2017-08-18 MED ORDER — ERENUMAB-AOOE 140 MG/ML ~~LOC~~ SOAJ: 140.0000 mg | SUBCUTANEOUS | 0 refills | Status: DC

## 2017-08-18 NOTE — Addendum Note (Signed)
Addended by: Geronimo RunningINKINS, Cierrah Dace A on: 08/18/2017 07:47 AM   Modules accepted: Orders

## 2017-08-18 NOTE — Telephone Encounter (Signed)
Aimovig order placed.

## 2017-08-21 ENCOUNTER — Ambulatory Visit
Admission: RE | Admit: 2017-08-21 | Discharge: 2017-08-21 | Disposition: A | Discharge status: Home / Self Care | Payer: Managed Care, Other (non HMO) | Source: Ambulatory Visit | Attending: Physician Assistant | Admitting: Physician Assistant

## 2017-08-21 DIAGNOSIS — R1013 Epigastric pain: Secondary | ICD-10-CM

## 2017-08-22 ENCOUNTER — Telehealth: Payer: Self-pay | Admitting: *Deleted

## 2017-08-22 NOTE — Telephone Encounter (Signed)
Pt has called to inform that she checked with  [id number] [place], [place] - [address] [phone] (Phone) [phone] (Fax)     On today and was told that a Prior Authorization is needed.  Pt is asking for a call for the status re: her Erenumab-aooe (AIMOVIG) 140 MG/ML SOAJ Please call

## 2017-08-22 NOTE — Telephone Encounter (Signed)
Initiated PA aimovig on covermymeds. Key: [id number]. In process of completing.

## 2017-08-22 NOTE — Telephone Encounter (Signed)
PA approved effective 07/23/17-08/22/18. NFAOZH:[id number]:[id number].    Pt pharmacy info for 2019:  ID#: [id number] RxPCN: [id number], RxGROUP: [id number], RxBIN: [id number]. Pharmacy help desk: [phone].   Pt has tried/failed: prozac, tizanidine, cambia, sumatriptan, tylenol, advil, toradol, aleve, qudexy, maxalt, zonegran. Beta blockers contraindicated d/t low BP.  Dx: [id number].

## 2017-08-22 NOTE — Telephone Encounter (Signed)
Spoke with pt. Directed her to the website www.aimovigaccesscard.com for the copay card to get it free and can use this through the end of the year at least. Informed pt that she can go ahead and get started on Aimovig while we wait for PA to be done as it could take several days. Discussed that if she has constipation she may need a stool softener but please call with any problems. She verbalized understanding and appreciation.

## 2017-08-24 ENCOUNTER — Ambulatory Visit: Payer: Managed Care, Other (non HMO) | Admitting: Neurology

## 2017-10-11 ENCOUNTER — Ambulatory Visit: Payer: Managed Care, Other (non HMO) | Admitting: Neurology

## 2017-10-11 ENCOUNTER — Encounter: Payer: Self-pay | Admitting: Neurology

## 2017-10-11 VITALS — BP 105/65 | HR 59 | Ht 64.0 in | Wt 129.0 lb

## 2017-10-11 DIAGNOSIS — G43711 Chronic migraine without aura, intractable, with status migrainosus: Secondary | ICD-10-CM

## 2017-10-11 MED ORDER — GALCANEZUMAB-GNLM 120 MG/ML ~~LOC~~ SOAJ: 120.0000 mg | SUBCUTANEOUS | 0 refills | Status: DC

## 2017-10-11 NOTE — Progress Notes (Addendum)
[facility]    Provider:  Dr Lucia GaskinsAhern Referring Provider: Richardean ChimeraMcComb, John, MD Primary Care Physician:  Richardean ChimeraMcComb, John, MD  CC:  Migraines and neck pain  Interval history:  Migraines are worsening.  Topiramate samples had side effects.  Ws on prozac in the past.  B-blockers contraindicated.  No medication overuse and no aura.  She has 20 headache days a month and 15 her migraines this is been ongoing for over 6 months. Will try emgality and in 3 months try again for botox.  Migraines are severe or moderately severe. +photo/phonophobia, +nausea, pounding/throbbing, movement makes it worse, they can last 24-72 hours. Aimovig with constipation  Interval history: Migraines are worsening.  Topiramate samples had side effects.  Ws on prozac in the past.  B-blockers contraindicated.  No medication overuse and no aura.  She has 20 headache days a month and 15 her migraines this is been ongoing for over 4 months.  She has been on multiple medications in the past including Prozac, we gave her multiple samples last time of topiramate and patient had significant cognitive impairment, will restart Prozac at this time, beta-blockers are contraindicated due to very low blood pressure.  Recommend Botox at this time. Migraines can last up to 24 hours and be severe. Cambia made her throw up.  HPI:  Carol Mckinney is a 47 y.o. female here as a referral from Dr. Arelia SneddonMcComb for migraines and neck pain. Started years ago and sporadic, once every 6 months. In the last year she has a cluster, lasts 4-5 days. Maybe around periods, unclear because her periods are not regular may may premenopausal. She has 16 headache days a month and 8 headache days a month that are migraines. Every month cluster of 4-5 days of refractory migraines. Migraines are on the left, the most uncomfortable pain, skin sensitivity around the left eye, pulsating, unilateral, light sensitivity, nausea. She takes imitrex but it take 90 minutes to  work. Imitrex works, it takes time. No other OTC medications. No aura. No medication overuse. Worsening over the last year. The ramp up is slow. She has neck pain, started in June/July, stiffness, decreased ROM and the right arm hurts. She has muscle tightness in the cervical paraspinals. If she sneezes the headache worsens and the neck pain worsens. Headache is positional. Never had imaging of her brain ir neck. Imitrex makes her head feel heavy and foggy. >!5 headache days a month.No other focal neurologic deficits, associated symptoms, inciting events or modifiable factors.  Meds tried: Prozac, topiramate(side effects), Maxalt (horrible), Imitrex (works but makes her groggy), prozac, b-blockers contraindicated due to low BP  Reviewed notes, labs and imaging from outside physicians, which showed:  Reviewed notes, her cycles are every 2-3 weeks and to 3 days of flow, not heavy, some distress and Aredia, sexual activity with no discomfort, previous bilateral tubal ligations. Has had problems with migraine headaches seem to cluster around her periods. Does have some mild stress incontinence. No change in bowel habits. May be perimenopausal anovulatory cycling. Exam was negative. Diagnosed with migraine headaches.  CMP normal 09/2014   Social History   Socioeconomic History  . Marital status: Married    Spouse name: Not on file  . Number of children: 2  . Years of education: Not on file  . Highest education level: Some college, no degree  Occupational History  . Occupation: Landproject coordinator    Comment: Journalist, [employer]  Social Needs  . Financial resource strain: Not on file  .  Food insecurity:    Worry: Not on file    Inability: Not on file  . Transportation needs:    Medical: Not on file    Non-medical: Not on file  Tobacco Use  . Smoking status: Former Smoker    Types: Cigarettes    Last attempt to quit: 02/28/2017    Years since quitting: 0.6  . Smokeless tobacco: Never Used  .  Tobacco comment: 5 cig a day  Substance and Sexual Activity  . Alcohol use: No  . Drug use: No  . Sexual activity: Yes    Birth control/protection: None  Lifestyle  . Physical activity:    Days per week: Not on file    Minutes per session: Not on file  . Stress: Not on file  Relationships  . Social connections:    Talks on phone: Not on file    Gets together: Not on file    Attends religious service: Not on file    Active member of club or organization: Not on file    Attends meetings of clubs or organizations: Not on file    Relationship status: Not on file  . Intimate partner violence:    Fear of current or ex partner: Not on file    Emotionally abused: Not on file    Physically abused: Not on file    Forced sexual activity: Not on file  Other Topics Concern  . Not on file  Social History Narrative   Lives at home with husband and daughters   Right handed   Caffeine 1 drink daily    History reviewed. No pertinent family history.  Past Medical History:  Diagnosis Date  . Chronic migraine without aura, with status migrainosus     Past Surgical History:  Procedure Laterality Date  . TUBAL LIGATION      Current Outpatient Medications  Medication Sig Dispense Refill  . FLUoxetine (PROZAC) 20 MG capsule Take 1 capsule (20 mg total) by mouth daily. 90 capsule 4  . SUMAtriptan (IMITREX) 100 MG tablet take one tablet at the onset of your headache. If it does not improve the symptoms take one additional tablet in 2 hours. Max 2 in 24hrs. 10 tablet 11  . Galcanezumab-gnlm (EMGALITY) 120 MG/ML SOAJ Inject 120 mg into the skin every 30 (thirty) days. 4 pen 0  . LINZESS 290 MCG CAPS capsule Take 290 mg by mouth 2 (two) times a week.  5  . tiZANidine (ZANAFLEX) 4 MG capsule Take 1 capsule (4 mg total) by mouth at bedtime as needed for muscle spasms. (Patient not taking: Reported on 10/11/2017) 30 capsule 11   No current facility-administered medications for this visit.     Facility-Administered Medications Ordered in Other Visits  Medication Dose Route Frequency Provider Last Rate Last Dose  . gadopentetate dimeglumine (MAGNEVIST) injection 11 mL  11 mL Intravenous Once PRN Anson FretAhern, Mysha Peeler B, MD        Allergies as of 10/11/2017 - Review Complete 10/11/2017  Allergen Reaction Noted  . Cambia [diclofenac potassium]  10/11/2017  . Topiramate er  10/11/2017  . Zonisamide  10/11/2017    Vitals: BP 105/65 (BP Location: Right Arm, Patient Position: Sitting)   Pulse (!) 59   Ht 5\' 4"  (1.626 m)   Wt 129 lb (58.5 kg)   BMI 22.14 kg/m  Last Weight:  Wt Readings from Last 1 Encounters:  10/11/17 129 lb (58.5 kg)   Last Height:   Ht Readings from Last 1 Encounters:  10/11/17 5\' 4"  (1.626 m)    Physical exam: Exam: Gen: NAD, conversant, well nourised, well groomed                      Neuro: Detailed Neurologic Exam  Speech:    Speech is normal; fluent and spontaneous with normal comprehension.  Cognition:    The patient is oriented to person, place, and time;     recent and remote memory intact;     language fluent;     normal attention, concentration,     fund of knowledge Cranial Nerves:    The pupils are equal, round, and reactive to light. The fundi are normal and spontaneous venous pulsations are present. Visual fields are full to finger confrontation. Extraocular movements are intact. Trigeminal sensation is intact and the muscles of mastication are normal. The face is symmetric. The palate elevates in the midline. Hearing intact. Voice is normal. Shoulder shrug is normal. The tongue has normal motion without fasciculations.   Coordination:    Normal finger to nose and heel to shin. Normal rapid alternating movements.   Gait:    Heel-toe and tandem gait are normal.   Motor Observation:    No asymmetry, no atrophy, and no involuntary movements noted. Tone:    Normal muscle tone.    Posture:    Posture is normal. normal erect     Strength:    Strength is V/V in the upper and lower limbs.      Sensation: intact to LT     Reflex Exam:    Assessment/Plan:  47 year old patient with chronic migraines without aura for at least one year, worsening, failed multiple classes medications. No medication overuse and no aura.  She has 20 headache days a month and 15 are migraines this is been ongoing for over 6 months at this higher frequency.    - She has been on multiple medications in the past including Prozac, we gave her multiple samples last time of topiramate and patient had significant cognitive impairment, will restart Prozac at this time, beta-blockers are contraindicated due to very low blood pressure.  Recommend Botox at this time. Tried PT did not help.   - Botox not approved. Aimovig with constipation. Will try emgality and if no improvement will retry approval for botox in 3 months.  Migraine acute management: With the imitrex (has tried Tylenol, Advil, Aleve, diclofenac tab, Toradol)  Hormonal fluctuations of migraine: Working with OBGYn about birth control which may help  I would like to see you back in for botox   Naomie Dean, MD  [facility] [address] [place], [place] [address]  Phone [phone] Fax [phone]  A total of 25 minutes was spent face-to-face with this patient. Over half this time was spent on counseling patient on the  1. Chronic migraine without aura, with intractable migraine, so stated, with status migrainosus    diagnosis and different diagnostic and therapeutic options available.

## 2017-10-17 ENCOUNTER — Ambulatory Visit (INDEPENDENT_AMBULATORY_CARE_PROVIDER_SITE_OTHER): Payer: Managed Care, Other (non HMO) | Admitting: Neurology

## 2017-10-17 DIAGNOSIS — G43711 Chronic migraine without aura, intractable, with status migrainosus: Secondary | ICD-10-CM

## 2017-10-17 MED ORDER — PROPRANOLOL HCL ER 60 MG PO CP24: 60.0000 mg | ORAL_CAPSULE | Freq: Every day | ORAL | 0 refills | Status: DC

## 2017-10-17 NOTE — Progress Notes (Signed)
Botox- 200 units x 1 vial Lot: [id number] Expiration: 10/2018 NDC: [id number]  Bacteriostatic 0.9% Sodium Chloride- 4mL total Lot: [id number] Expiration: 11/29/2018 NDC: [id number]  Dx: G43.711 Samples used

## 2017-10-17 NOTE — Progress Notes (Signed)

## 2017-12-16 ENCOUNTER — Other Ambulatory Visit: Payer: Self-pay | Admitting: Neurology

## 2017-12-28 ENCOUNTER — Telehealth: Payer: Self-pay | Admitting: Neurology

## 2017-12-28 NOTE — Telephone Encounter (Signed)
We can try but it looks like her current insurance is [place].. They do ask about CGRP's, is the patient currently on any?

## 2017-12-28 NOTE — Telephone Encounter (Signed)
Reuel Boom, I discussed with patient that we would try again to get botox approved, I think she changed insurance. Would you call her and verify she is still interested in botox and we can try again to get her approved? thanks

## 2018-01-02 NOTE — Telephone Encounter (Signed)
I am not sure, would you call and ask please. Ask if she is still on cgrp, how she is doing on it and if she would orefer to stop the cgrp in order to try and get botox approved. thanls

## 2018-01-03 NOTE — Telephone Encounter (Signed)
I called the patient and she stated that she just took a cgrp injection within the last week. She says that they are helping and she hasnt had a migraine since she started them. If she has to choose between the two she would like to proceed with botox.

## 2018-01-03 NOTE — Telephone Encounter (Signed)
Patient also stated that her insurance is changing at the beginning of the year, she is going to call and give me the new info when she gets it.

## 2018-01-29 ENCOUNTER — Telehealth: Payer: Self-pay | Admitting: Neurology

## 2018-01-29 ENCOUNTER — Other Ambulatory Visit: Payer: Self-pay | Admitting: *Deleted

## 2018-01-29 DIAGNOSIS — G43711 Chronic migraine without aura, intractable, with status migrainosus: Secondary | ICD-10-CM

## 2018-01-29 MED ORDER — GALCANEZUMAB-GNLM 120 MG/ML ~~LOC~~ SOAJ: 120.0000 mg | SUBCUTANEOUS | 5 refills | Status: DC

## 2018-01-29 NOTE — Telephone Encounter (Signed)
Spoke with patient and discussed that for now she can continue Emgality and as previously discussed (with Duwayne HeckDanielle) advised pt to let us know as soon as she gets her new insurance information. Pt verbalized appreciation. She is aware that Emgality was called into [facility].

## 2018-01-29 NOTE — Telephone Encounter (Signed)
Emgality 120 mg monthly ordered per v.o. Dr. Lucia GaskinsAhern.

## 2018-01-29 NOTE — Telephone Encounter (Signed)
Patient called and requested a refill for the Emgalty. Please call and advise. DW

## 2018-01-29 NOTE — Telephone Encounter (Signed)
Spoke with Dr. Lucia GaskinsAhern. Pt can continue Emgality for now.

## 2018-01-30 NOTE — Telephone Encounter (Signed)
Received PA request for pt's emgality from [facility]. KEY: [id number] in covermymeds. [facility] to [employer] [facility] [employer] [facility]. Approved: "CaseId:[id number];Status:Approved;Review Type:Prior Auth;Coverage Start Date:12/31/2017;Coverage End Date:01/30/2019."  [facility] informed.

## 2018-03-01 ENCOUNTER — Telehealth: Payer: Self-pay | Admitting: Neurology

## 2018-03-01 NOTE — Telephone Encounter (Signed)
[employer] An[employer] ID: [id number] phone number [phone].

## 2018-03-07 ENCOUNTER — Telehealth: Payer: Self-pay | Admitting: Neurology

## 2018-03-07 NOTE — Telephone Encounter (Signed)
I called the patient's insurance to check coverage of Botox injections. Covered at 80 percent, deductible applies 750,  out of pocket 5,000. NPR eligible for B/B. JKK#[id number]. DW   I called and made the patient aware the she may be responsible for close to 1,000 dollars. We scheduled apt. We also discussed patient assistance.

## 2018-03-07 NOTE — Telephone Encounter (Signed)
I called the patient's insurance to check coverage of Botox injections. Covered at 80 percent, deductible applies 750,  out of pocket 5,000. NPR eligible for B/B. HUT#[id number]. DW

## 2018-03-07 NOTE — Telephone Encounter (Signed)
Pt called wanting to know if you were able to get in contact with the insurance company on whether or not they will cover the Botox. Please advise.

## 2018-03-21 ENCOUNTER — Ambulatory Visit (INDEPENDENT_AMBULATORY_CARE_PROVIDER_SITE_OTHER): Payer: BLUE CROSS/BLUE SHIELD | Admitting: Neurology

## 2018-03-21 DIAGNOSIS — G43711 Chronic migraine without aura, intractable, with status migrainosus: Secondary | ICD-10-CM | POA: Diagnosis not present

## 2018-03-21 NOTE — Progress Notes (Signed)
Botox- 100 units x 2 vials Lot:[id number] Expiration: 07/2020 NDC: 0023-1145-01  Bacteriostatic 0.9% Sodium Chloride- 4mL total Lot: [id number] Expiration: 11/29/2018 NDC: 0409-1966-02  Dx: G43.711 B/B    

## 2018-03-21 NOTE — Progress Notes (Signed)
Consent Form Botulism Toxin Injection For Chronic Migraine  Interval history: This is our second botox. Doing extremely well, just on botox no cgrp.   Reviewed orally with patient, additionally signature is on file:  Botulism toxin has been approved by the Federal drug administration for treatment of chronic migraine. Botulism toxin does not cure chronic migraine and it may not be effective in some patients.  The administration of botulism toxin is accomplished by injecting a small amount of toxin into the muscles of the neck and head. Dosage must be titrated for each individual. Any benefits resulting from botulism toxin tend to wear off after 3 months with a repeat injection required if benefit is to be maintained. Injections are usually done every 3-4 months with maximum effect peak achieved by about 2 or 3 weeks. Botulism toxin is expensive and you should be sure of what costs you will incur resulting from the injection.  The side effects of botulism toxin use for chronic migraine may include:   -Transient, and usually mild, facial weakness with facial injections  -Transient, and usually mild, head or neck weakness with head/neck injections  -Reduction or loss of forehead facial animation due to forehead muscle weakness  -Eyelid drooping  -Dry eye  -Pain at the site of injection or bruising at the site of injection  -Double vision  -Potential unknown long term risks  Contraindications: You should not have Botox if you are pregnant, nursing, allergic to albumin, have an infection, skin condition, or muscle weakness at the site of the injection, or have myasthenia gravis, Lambert-Eaton syndrome, or ALS.  It is also possible that as with any injection, there may be an allergic reaction or no effect from the medication. Reduced effectiveness after repeated injections is sometimes seen and rarely infection at the injection site may occur. All care will be taken to prevent these side  effects. If therapy is given over a long time, atrophy and wasting in the muscle injected may occur. Occasionally the patient's become refractory to treatment because they develop antibodies to the toxin. In this event, therapy needs to be modified.  I have read the above information and consent to the administration of botulism toxin.    BOTOX PROCEDURE NOTE FOR MIGRAINE HEADACHE    Contraindications and precautions discussed with patient(above). Aseptic procedure was observed and patient tolerated procedure. Procedure performed by Dr. Artemio Aly  The condition has existed for more than 6 months, and pt does not have a diagnosis of ALS, Myasthenia Gravis or Lambert-Eaton Syndrome.  Risks and benefits of injections discussed and pt agrees to proceed with the procedure.  Written consent obtained  These injections are medically necessary. Pt  receives good benefits from these injections. These injections do not cause sedations or hallucinations which the oral therapies may cause.  Indication/Diagnosis: chronic migraine BOTOX(J0585) injection was performed according to protocol by [employer]. 200 units of BOTOX was dissolved into 4 cc NS.   NDC: [address]-28   Description of procedure:  The patient was placed in a sitting position. The standard protocol was used for Botox as follows, with 5 units of Botox injected at each site:   -Procerus muscle, midline injection  -Corrugator muscle, bilateral injection  -Frontalis muscle, bilateral injection, with 2 sites each side, medial injection was performed in the upper one third of the frontalis muscle, in the region vertical from the medial inferior edge of the superior orbital rim. The lateral injection was again in the upper one third of the  forehead vertically above the lateral limbus of the cornea, 1.5 cm lateral to the medial injection site.  -Temporalis muscle injection, 4 sites, bilaterally. The first injection was 3 cm above the tragus  of the ear, second injection site was 1.5 cm to 3 cm up from the first injection site in line with the tragus of the ear. The third injection site was 1.5-3 cm forward between the first 2 injection sites. The fourth injection site was 1.5 cm posterior to the second injection site.  -Occipitalis muscle injection, 3 sites, bilaterally. The first injection was done one half way between the occipital protuberance and the tip of the mastoid process behind the ear. The second injection site was done lateral and superior to the first, 1 fingerbreadth from the first injection. The third injection site was 1 fingerbreadth superiorly and medially from the first injection site.  -Cervical paraspinal muscle injection, 2 sites, bilateral knee first injection site was 1 cm from the midline of the cervical spine, 3 cm inferior to the lower border of the occipital protuberance. The second injection site was 1.5 cm superiorly and laterally to the first injection site.  -Trapezius muscle injection was performed at 3 sites, bilaterally. The first injection site was in the upper trapezius muscle halfway between the inflection point of the neck, and the acromion. The second injection site was one half way between the acromion and the first injection site. The third injection was done between the first injection site and the inflection point of the neck.   Will return for repeat injection in 3 months.   A 200 unit sof Botox was used, 155 units were injected, the rest of the Botox was wasted. The patient tolerated the procedure well, there were no complications of the above procedure.

## 2018-06-22 ENCOUNTER — Ambulatory Visit: Payer: BLUE CROSS/BLUE SHIELD | Admitting: Neurology

## 2018-07-10 ENCOUNTER — Other Ambulatory Visit: Payer: Self-pay

## 2018-07-10 ENCOUNTER — Ambulatory Visit: Payer: BLUE CROSS/BLUE SHIELD | Admitting: Neurology

## 2018-07-10 DIAGNOSIS — G43711 Chronic migraine without aura, intractable, with status migrainosus: Secondary | ICD-10-CM | POA: Diagnosis not present

## 2018-07-10 MED ORDER — UBROGEPANT 50 MG PO TABS: 50.0000 mg | ORAL_TABLET | ORAL | 6 refills | Status: DC | PRN

## 2018-07-10 NOTE — Progress Notes (Signed)
Consent Form Botulism Toxin Injection For Chronic Migraine  Interval history 07/10/2018: This is our third botox. Not on cgrp. Baseline was 20 headache days a month and 15 migraine days a month. (tried Aimovig with constipation, tried emgality did not tolerate), failed multiple classes of meds and has side effects to many medications). 10/17/2017 was out first injection, 03/21/2018 last and today 07/10/2018 our third. She has done exceptionally well. She is NOT on cgrp. With botox she may have 1 migraine day a month. Imitrex works but causing side effects, will try the new Ubrelvy for acute management. +Orbic Oris, see picture for forehead. No masseters, no temples, no LS.   Reviewed orally with patient, additionally signature is on file:  Botulism toxin has been approved by the Federal drug administration for treatment of chronic migraine. Botulism toxin does not cure chronic migraine and it may not be effective in some patients.  The administration of botulism toxin is accomplished by injecting a small amount of toxin into the muscles of the neck and head. Dosage must be titrated for each individual. Any benefits resulting from botulism toxin tend to wear off after 3 months with a repeat injection required if benefit is to be maintained. Injections are usually done every 3-4 months with maximum effect peak achieved by about 2 or 3 weeks. Botulism toxin is expensive and you should be sure of what costs you will incur resulting from the injection.  The side effects of botulism toxin use for chronic migraine may include:   -Transient, and usually mild, facial weakness with facial injections  -Transient, and usually mild, head or neck weakness with head/neck injections  -Reduction or loss of forehead facial animation due to forehead muscle weakness  -Eyelid drooping  -Dry eye  -Pain at the site of injection or bruising at the site of injection  -Double vision  -Potential unknown long term risks   Contraindications: You should not have Botox if you are pregnant, nursing, allergic to albumin, have an infection, skin condition, or muscle weakness at the site of the injection, or have myasthenia gravis, Lambert-Eaton syndrome, or ALS.  It is also possible that as with any injection, there may be an allergic reaction or no effect from the medication. Reduced effectiveness after repeated injections is sometimes seen and rarely infection at the injection site may occur. All care will be taken to prevent these side effects. If therapy is given over a long time, atrophy and wasting in the muscle injected may occur. Occasionally the patient's become refractory to treatment because they develop antibodies to the toxin. In this event, therapy needs to be modified.  I have read the above information and consent to the administration of botulism toxin.    BOTOX PROCEDURE NOTE FOR MIGRAINE HEADACHE    Contraindications and precautions discussed with patient(above). Aseptic procedure was observed and patient tolerated procedure. Procedure performed by Dr. Artemio Aly  The condition has existed for more than 6 months, and pt does not have a diagnosis of ALS, Myasthenia Gravis or Lambert-Eaton Syndrome.  Risks and benefits of injections discussed and pt agrees to proceed with the procedure.  Written consent obtained  These injections are medically necessary. Pt  receives good benefits from these injections. These injections do not cause sedations or hallucinations which the oral therapies may cause.  Description of procedure:  The patient was placed in a sitting position. The standard protocol was used for Botox as follows, with 5 units of Botox injected at each site:   -  Procerus muscle, midline injection  -Corrugator muscle, bilateral injection  -Frontalis muscle, bilateral injection, with 2 sites each side, medial injection was performed in the upper one third of the frontalis muscle, in the  region vertical from the medial inferior edge of the superior orbital rim. The lateral injection was again in the upper one third of the forehead vertically above the lateral limbus of the cornea, 1.5 cm lateral to the medial injection site.  -Temporalis muscle injection, 5 sites, bilaterally. The first injection was 3 cm above the tragus of the ear, second injection site was 1.5 cm to 3 cm up from the first injection site in line with the tragus of the ear. The third injection site was 1.5-3 cm forward between the first 2 injection sites. The fourth injection site was 1.5 cm posterior to the second injection site.   - Patient feels the migraines are centered around the eyes +5 units bilaterally at the outer canthus in the orbicularis occuli  -Occipitalis muscle injection, 3 sites, bilaterally. The first injection was done one half way between the occipital protuberance and the tip of the mastoid process behind the ear. The second injection site was done lateral and superior to the first, 1 fingerbreadth from the first injection. The third injection site was 1 fingerbreadth superiorly and medially from the first injection site.  -Cervical paraspinal muscle injection, 2 sites, bilateral knee first injection site was 1 cm from the midline of the cervical spine, 3 cm inferior to the lower border of the occipital protuberance. The second injection site was 1.5 cm superiorly and laterally to the first injection site.  -Trapezius muscle injection was performed at 3 sites, bilaterally. The first injection site was in the upper trapezius muscle halfway between the inflection point of the neck, and the acromion. The second injection site was one half way between the acromion and the first injection site. The third injection was done between the first injection site and the inflection point of the neck.   Will return for repeat injection in 3 months.   A 200 unit sof Botox was used, any Botox not injected was  wasted. The patient tolerated the procedure well, there were no complications of the above procedure.

## 2018-07-10 NOTE — Progress Notes (Signed)
Botox- 100 units x 2 vials Lot: [id number] Expiration: 11/2020 NDC: 3419-6222-97  Bacteriostatic 0.9% Sodium Chloride- 27mL total Lot: [id number] Expiration: 11/29/2018 NDC: 9417-4081-44  Dx: Y18.563 B/B

## 2018-10-01 ENCOUNTER — Ambulatory Visit: Payer: BLUE CROSS/BLUE SHIELD | Admitting: Neurology

## 2018-10-10 ENCOUNTER — Telehealth: Payer: Self-pay | Admitting: Neurology

## 2018-10-10 NOTE — Telephone Encounter (Signed)
10/10/2018 Botox B/B I have talked to patient she is aware if her copay come back high she will have to pay Jayme Cloud

## 2018-10-15 NOTE — Telephone Encounter (Signed)
Noted, thank you. DW  °

## 2018-10-16 ENCOUNTER — Ambulatory Visit: Payer: BC Managed Care – PPO | Admitting: Neurology

## 2018-10-16 ENCOUNTER — Telehealth: Payer: Self-pay | Admitting: Neurology

## 2018-10-16 ENCOUNTER — Other Ambulatory Visit: Payer: Self-pay

## 2018-10-16 VITALS — Temp 97.7°F

## 2018-10-16 DIAGNOSIS — G43711 Chronic migraine without aura, intractable, with status migrainosus: Secondary | ICD-10-CM

## 2018-10-16 NOTE — Telephone Encounter (Signed)
I called and scheduled the patient. DW  °

## 2018-10-16 NOTE — Progress Notes (Signed)
Consent Form Botulism Toxin Injection For Chronic Migraine  Interval history 8/182020: This is our 4th botox. Not on cgrp. Baseline was 20 headache days a month and 15 migraine days a month. (tried Aimovig with constipation, tried emgality did not tolerate), failed multiple classes of meds and has side effects to many medications).  She has done exceptionally well. She is NOT on cgrp. With botox she may have 1 migraine day a month. Imitrex works but causing side effects - side effects have resolved, prescribed the new Ubrelvy for acute management at last appointment. +Orbic Oris, see picture for forehead. No masseters, no temples, no LS.  She felt like her forehead was heavy we changed (see picture) and used 15 extra units at the upper cervical paraspinals.   She has not used Ubrelvy. She feels better now, she started Prozac. Did not try the ubrelvy. She is in menopause and has irregular periods. Imitrex is working, hasn't had to try Bernita Raisinubrelvy may take in the future as needed. She started the Prozac a month ago she is doing much better and working out has helped. I am happy to increase Prozac to 40mg  if needed.   Reviewed orally with patient, additionally signature is on file:  Botulism toxin has been approved by the Federal drug administration for treatment of chronic migraine. Botulism toxin does not cure chronic migraine and it may not be effective in some patients.  The administration of botulism toxin is accomplished by injecting a small amount of toxin into the muscles of the neck and head. Dosage must be titrated for each individual. Any benefits resulting from botulism toxin tend to wear off after 3 months with a repeat injection required if benefit is to be maintained. Injections are usually done every 3-4 months with maximum effect peak achieved by about 2 or 3 weeks. Botulism toxin is expensive and you should be sure of what costs you will incur resulting from the injection.  The side  effects of botulism toxin use for chronic migraine may include:   -Transient, and usually mild, facial weakness with facial injections  -Transient, and usually mild, head or neck weakness with head/neck injections  -Reduction or loss of forehead facial animation due to forehead muscle weakness  -Eyelid drooping  -Dry eye  -Pain at the site of injection or bruising at the site of injection  -Double vision  -Potential unknown long term risks  Contraindications: You should not have Botox if you are pregnant, nursing, allergic to albumin, have an infection, skin condition, or muscle weakness at the site of the injection, or have myasthenia gravis, Lambert-Eaton syndrome, or ALS.  It is also possible that as with any injection, there may be an allergic reaction or no effect from the medication. Reduced effectiveness after repeated injections is sometimes seen and rarely infection at the injection site may occur. All care will be taken to prevent these side effects. If therapy is given over a long time, atrophy and wasting in the muscle injected may occur. Occasionally the patient's become refractory to treatment because they develop antibodies to the toxin. In this event, therapy needs to be modified.  I have read the above information and consent to the administration of botulism toxin.    BOTOX PROCEDURE NOTE FOR MIGRAINE HEADACHE    Contraindications and precautions discussed with patient(above). Aseptic procedure was observed and patient tolerated procedure. Procedure performed by Dr. Artemio Alyoni   The condition has existed for more than 6 months, and pt does not have  a diagnosis of ALS, Myasthenia Gravis or Lambert-Eaton Syndrome.  Risks and benefits of injections discussed and pt agrees to proceed with the procedure.  Written consent obtained  These injections are medically necessary. Pt  receives good benefits from these injections. These injections do not cause sedations or hallucinations  which the oral therapies may cause.  Description of procedure:  The patient was placed in a sitting position. The standard protocol was used for Botox as follows, with 5 units of Botox injected at each site:   -Procerus muscle, midline injection  -Corrugator muscle, bilateral injection  -Frontalis muscle, bilateral injection, with 2 sites each side, medial injection was performed in the upper one third of the frontalis muscle, in the region vertical from the medial inferior edge of the superior orbital rim. The lateral injection was again in the upper one third of the forehead vertically above the lateral limbus of the cornea, 1.5 cm lateral to the medial injection site.  -Temporalis muscle injection, 5 sites, bilaterally. The first injection was 3 cm above the tragus of the ear, second injection site was 1.5 cm to 3 cm up from the first injection site in line with the tragus of the ear. The third injection site was 1.5-3 cm forward between the first 2 injection sites. The fourth injection site was 1.5 cm posterior to the second injection site.   - Patient feels the migraines are centered around the eyes +5 units bilaterally at the outer canthus in the orbicularis occuli  -Occipitalis muscle injection, 3 sites, bilaterally. The first injection was done one half way between the occipital protuberance and the tip of the mastoid process behind the ear. The second injection site was done lateral and superior to the first, 1 fingerbreadth from the first injection. The third injection site was 1 fingerbreadth superiorly and medially from the first injection site.  -Cervical paraspinal muscle injection, 3 sites used 15U each, bilateral first injection site was 1 cm from the midline of the cervical spine, 3 cm inferior to the lower border of the occipital protuberance. The third injection site was 1.5 cm superiorly and laterally to the first injection site.  -Trapezius muscle injection was performed at  3 sites, bilaterally. The first injection site was in the upper trapezius muscle halfway between the inflection point of the neck, and the acromion. The second injection site was one half way between the acromion and the first injection site. The third injection was done between the first injection site and the inflection point of the neck.   Will return for repeat injection in 3 months.   A 200 unit sof Botox was used, any Botox not injected was wasted. The patient tolerated the procedure well, there were no complications of the above procedure.

## 2018-10-16 NOTE — Telephone Encounter (Signed)
3 mo Botox inj w/ Jaynee Eagles

## 2018-10-16 NOTE — Progress Notes (Signed)
Botox- 100 units x 2 vials Lot: [id number] Expiration: 01/2021 NDC: 0023-1145-01  Bacteriostatic 0.9% Sodium Chloride- 4mL total Lot: [id number] Expiration: 11/29/2018 NDC: 0409-1966-02  Dx: G43.711 B/B   

## 2019-01-01 ENCOUNTER — Other Ambulatory Visit: Payer: Self-pay | Admitting: Neurology

## 2019-01-02 ENCOUNTER — Telehealth: Payer: Self-pay

## 2019-01-02 NOTE — Telephone Encounter (Signed)
For [employer]  PA on cover my meds  CaseId:[id number];Status:Approved;Review Type:Prior Auth;Coverage Start Date:12/03/2018;Coverage End Date:01/02/2020;

## 2019-01-14 NOTE — Progress Notes (Signed)
Consent Form Botulism Toxin Injection For Chronic Migraine  Interval history 8/182020: This is our 4th botox. Not on cgrp. Baseline was 20 headache days a month and 15 migraine days a month. (tried Aimovig with constipation, tried emgality did not tolerate), failed multiple classes of meds and has side effects to many medications).  She has done exceptionally well. She is NOT on cgrp. With botox she may have 1 migraine day a month. Imitrex works but causing side effects - side effects have resolved, +o, see picture for forehead.   She felt like her forehead was heavy we changed (see picture) and used 15 extra units at the upper cervical paraspinals.   She has not used Ubrelvy.Will try nurtec today gave samples, also she can take alleve with the imitrex if needed she can try that.  Did not try the ubrelvy. She is in menopause and has irregular periods. Imitrex is working, I am happy to increase Prozac to 40mg  if needed.   Consent Form Botulism Toxin Injection For Chronic Migraine    Reviewed orally with patient, additionally signature is on file:  Botulism toxin has been approved by the Federal drug administration for treatment of chronic migraine. Botulism toxin does not cure chronic migraine and it may not be effective in some patients.  The administration of botulism toxin is accomplished by injecting a small amount of toxin into the muscles of the neck and head. Dosage must be titrated for each individual. Any benefits resulting from botulism toxin tend to wear off after 3 months with a repeat injection required if benefit is to be maintained. Injections are usually done every 3-4 months with maximum effect peak achieved by about 2 or 3 weeks. Botulism toxin is expensive and you should be sure of what costs you will incur resulting from the injection.  The side effects of botulism toxin use for chronic migraine may include:   -Transient, and usually mild, facial weakness with facial  injections  -Transient, and usually mild, head or neck weakness with head/neck injections  -Reduction or loss of forehead facial animation due to forehead muscle weakness  -Eyelid drooping  -Dry eye  -Pain at the site of injection or bruising at the site of injection  -Double vision  -Potential unknown long term risks  Contraindications: You should not have Botox if you are pregnant, nursing, allergic to albumin, have an infection, skin condition, or muscle weakness at the site of the injection, or have myasthenia gravis, Lambert-Eaton syndrome, or ALS.  It is also possible that as with any injection, there may be an allergic reaction or no effect from the medication. Reduced effectiveness after repeated injections is sometimes seen and rarely infection at the injection site may occur. All care will be taken to prevent these side effects. If therapy is given over a long time, atrophy and wasting in the muscle injected may occur. Occasionally the patient's become refractory to treatment because they develop antibodies to the toxin. In this event, therapy needs to be modified.  I have read the above information and consent to the administration of botulism toxin.    BOTOX PROCEDURE NOTE FOR MIGRAINE HEADACHE    Contraindications and precautions discussed with patient(above). Aseptic procedure was observed and patient tolerated procedure. Procedure performed by Dr. Georgia Dom  The condition has existed for more than 6 months, and pt does not have a diagnosis of ALS, Myasthenia Gravis or Lambert-Eaton Syndrome.  Risks and benefits of injections discussed and pt agrees to proceed  with the procedure.  Written consent obtained  These injections are medically necessary. Pt  receives good benefits from these injections. These injections do not cause sedations or hallucinations which the oral therapies may cause.  Description of procedure:  The patient was placed in a sitting position. The  standard protocol was used for Botox as follows, with 5 units of Botox injected at each site:   -Procerus muscle, midline injection  -Corrugator muscle, bilateral injection  -Frontalis muscle, bilateral injection, with 2 sites each side, medial injection was performed in the upper one third of the frontalis muscle, in the region vertical from the medial inferior edge of the superior orbital rim. The lateral injection was again in the upper one third of the forehead vertically above the lateral limbus of the cornea, 1.5 cm lateral to the medial injection site.  -Temporalis muscle injection, 4 sites, bilaterally. The first injection was 3 cm above the tragus of the ear, second injection site was 1.5 cm to 3 cm up from the first injection site in line with the tragus of the ear. The third injection site was 1.5-3 cm forward between the first 2 injection sites. The fourth injection site was 1.5 cm posterior to the second injection site.   -Occipitalis muscle injection, 3 sites, bilaterally. The first injection was done one half way between the occipital protuberance and the tip of the mastoid process behind the ear. The second injection site was done lateral and superior to the first, 1 fingerbreadth from the first injection. The third injection site was 1 fingerbreadth superiorly and medially from the first injection site.  -Cervical paraspinal muscle injection, 2 sites, bilateral knee first injection site was 1 cm from the midline of the cervical spine, 3 cm inferior to the lower border of the occipital protuberance. The second injection site was 1.5 cm superiorly and laterally to the first injection site.  -Trapezius muscle injection was performed at 3 sites, bilaterally. The first injection site was in the upper trapezius muscle halfway between the inflection point of the neck, and the acromion. The second injection site was one half way between the acromion and the first injection site. The third  injection was done between the first injection site and the inflection point of the neck.   Will return for repeat injection in 3 months.   200 units of Botox was used, any Botox not injected was wasted. The patient tolerated the procedure well, there were no complications of the above procedure.

## 2019-01-15 ENCOUNTER — Other Ambulatory Visit: Payer: Self-pay

## 2019-01-15 ENCOUNTER — Telehealth: Payer: Self-pay | Admitting: *Deleted

## 2019-01-15 ENCOUNTER — Ambulatory Visit: Payer: BC Managed Care – PPO | Admitting: Neurology

## 2019-01-15 VITALS — Temp 97.8°F

## 2019-01-15 DIAGNOSIS — G43711 Chronic migraine without aura, intractable, with status migrainosus: Secondary | ICD-10-CM | POA: Diagnosis not present

## 2019-01-15 MED ORDER — NURTEC 75 MG PO TBDP: 75.0000 mg | ORAL_TABLET | Freq: Every day | ORAL | 6 refills | Status: DC | PRN

## 2019-01-15 NOTE — Progress Notes (Signed)
Botox- 200 units x 1 vial Lot: [id number] Expiration: 09/2021 NDC: 4932-4199-14  Bacteriostatic 0.9% Sodium Chloride- 15mL total Lot: [id number] Expiration: 08/29/2019 NDC: 3507-5732-25  Dx: O72.091 B/B

## 2019-01-15 NOTE — Telephone Encounter (Signed)
Completed Ubrelvy 50 mg PA on CMM. Key: [id number]. Approved immediately by [employer].   CaseId:[id number];Status:Approved;Review Type:Prior Auth;Coverage Start Date:12/16/2018;Coverage End Date:01/15/2020;

## 2019-04-18 ENCOUNTER — Telehealth: Payer: Self-pay

## 2019-04-18 NOTE — Telephone Encounter (Signed)
I called the patients insurance and spoke with She R. who stated codes were covered at 80 percent and [employer] MVH#[id number].   Pre determination-[phone] then [phone]

## 2019-04-23 ENCOUNTER — Ambulatory Visit: Payer: BC Managed Care – PPO | Admitting: Neurology

## 2019-05-16 ENCOUNTER — Telehealth: Payer: Self-pay | Admitting: Neurology

## 2019-05-16 NOTE — Telephone Encounter (Signed)
Pt has called asking about the status of her Botox and to them schedule her Botox appointment, please call

## 2019-06-11 ENCOUNTER — Telehealth: Payer: Self-pay | Admitting: *Deleted

## 2019-06-11 NOTE — Telephone Encounter (Signed)
I called [employer] [phone] and spoke to Max.  She states that [id number] and [id number] are billable and do not require PA or a Predetermination.  Ref# for this call is [id number]

## 2019-06-24 ENCOUNTER — Ambulatory Visit (INDEPENDENT_AMBULATORY_CARE_PROVIDER_SITE_OTHER): Payer: BC Managed Care – PPO | Admitting: Neurology

## 2019-06-24 VITALS — Temp 97.9°F

## 2019-06-24 DIAGNOSIS — G43711 Chronic migraine without aura, intractable, with status migrainosus: Secondary | ICD-10-CM

## 2019-06-24 NOTE — Progress Notes (Signed)
Botox- 200 units x 1 vial Lot: [id number] Expiration: 01/2022 NDC: 0023-3921-02  Bacteriostatic 0.9% Sodium Chloride- 4mL total Lot: [id number] Expiration: 08/29/2019 NDC: 0409-1966-02  Dx: G43.711 B/B  

## 2019-06-24 NOTE — Progress Notes (Signed)
Consent Form Botulism Toxin Injection For Chronic Migraine  Interval history 06/24/2019: TNot on cgrp. Baseline was 20 headache days a month and 15 migraine days a month. (tried Aimovig with constipation, tried emgality did not tolerate), failed multiple classes of meds and has side effects to many medications).  She has done exceptionally well. She is NOT on cgrp. With botox she may have 1 migraine day a month or less > 99% reduction in frequency. Imitrex works but causing side effects - side effects have resolved, +o, see picture for forehead.   She felt like her forehead was heavy we changed (see picture from 8/18) and used 15 extra units at the upper cervical paraspinals.    also she can take alleve with the imitrex if needed she can try that.  She is in menopause and has irregular periods. Imitrex is working, I am happy to increase Prozac to 40mg  if needed.   Consent Form Botulism Toxin Injection For Chronic Migraine    Reviewed orally with patient, additionally signature is on file:  Botulism toxin has been approved by the Federal drug administration for treatment of chronic migraine. Botulism toxin does not cure chronic migraine and it may not be effective in some patients.  The administration of botulism toxin is accomplished by injecting a small amount of toxin into the muscles of the neck and head. Dosage must be titrated for each individual. Any benefits resulting from botulism toxin tend to wear off after 3 months with a repeat injection required if benefit is to be maintained. Injections are usually done every 3-4 months with maximum effect peak achieved by about 2 or 3 weeks. Botulism toxin is expensive and you should be sure of what costs you will incur resulting from the injection.  The side effects of botulism toxin use for chronic migraine may include:   -Transient, and usually mild, facial weakness with facial injections  -Transient, and usually mild, head or neck weakness  with head/neck injections  -Reduction or loss of forehead facial animation due to forehead muscle weakness  -Eyelid drooping  -Dry eye  -Pain at the site of injection or bruising at the site of injection  -Double vision  -Potential unknown long term risks  Contraindications: You should not have Botox if you are pregnant, nursing, allergic to albumin, have an infection, skin condition, or muscle weakness at the site of the injection, or have myasthenia gravis, Lambert-Eaton syndrome, or ALS.  It is also possible that as with any injection, there may be an allergic reaction or no effect from the medication. Reduced effectiveness after repeated injections is sometimes seen and rarely infection at the injection site may occur. All care will be taken to prevent these side effects. If therapy is given over a long time, atrophy and wasting in the muscle injected may occur. Occasionally the patient's become refractory to treatment because they develop antibodies to the toxin. In this event, therapy needs to be modified.  I have read the above information and consent to the administration of botulism toxin.    BOTOX PROCEDURE NOTE FOR MIGRAINE HEADACHE    Contraindications and precautions discussed with patient(above). Aseptic procedure was observed and patient tolerated procedure. Procedure performed by Dr. Georgia Dom  The condition has existed for more than 6 months, and pt does not have a diagnosis of ALS, Myasthenia Gravis or Lambert-Eaton Syndrome.  Risks and benefits of injections discussed and pt agrees to proceed with the procedure.  Written consent obtained  These injections  are medically necessary. Pt  receives good benefits from these injections. These injections do not cause sedations or hallucinations which the oral therapies may cause.  Description of procedure:  The patient was placed in a sitting position. The standard protocol was used for Botox as follows, with 5 units of Botox  injected at each site:   -Procerus muscle, midline injection  -Corrugator muscle, bilateral injection  -Frontalis muscle, bilateral injection, with 2 sites each side, medial injection was performed in the upper one third of the frontalis muscle, in the region vertical from the medial inferior edge of the superior orbital rim. The lateral injection was again in the upper one third of the forehead vertically above the lateral limbus of the cornea, 1.5 cm lateral to the medial injection site.  -Temporalis muscle injection, 4 sites, bilaterally. The first injection was 3 cm above the tragus of the ear, second injection site was 1.5 cm to 3 cm up from the first injection site in line with the tragus of the ear. The third injection site was 1.5-3 cm forward between the first 2 injection sites. The fourth injection site was 1.5 cm posterior to the second injection site.   -Occipitalis muscle injection, 3 sites, bilaterally. The first injection was done one half way between the occipital protuberance and the tip of the mastoid process behind the ear. The second injection site was done lateral and superior to the first, 1 fingerbreadth from the first injection. The third injection site was 1 fingerbreadth superiorly and medially from the first injection site.  -Cervical paraspinal muscle injection, 2 sites, bilateral knee first injection site was 1 cm from the midline of the cervical spine, 3 cm inferior to the lower border of the occipital protuberance. The second injection site was 1.5 cm superiorly and laterally to the first injection site.  -Trapezius muscle injection was performed at 3 sites, bilaterally. The first injection site was in the upper trapezius muscle halfway between the inflection point of the neck, and the acromion. The second injection site was one half way between the acromion and the first injection site. The third injection was done between the first injection site and the inflection  point of the neck.   Will return for repeat injection in 3 months.   200 units of Botox was used, any Botox not injected was wasted. The patient tolerated the procedure well, there were no complications of the above procedure.

## 2019-09-11 ENCOUNTER — Telehealth: Payer: Self-pay | Admitting: Neurology

## 2019-09-11 NOTE — Telephone Encounter (Signed)
Patient has Botox injection on 7/27. I called [employer] [phone]) and spoke with Raz to see if PA is needed for J0585 or 26948. She states no PA is required for either. Reference #[id number].

## 2019-09-24 ENCOUNTER — Other Ambulatory Visit: Payer: Self-pay

## 2019-09-24 ENCOUNTER — Ambulatory Visit (INDEPENDENT_AMBULATORY_CARE_PROVIDER_SITE_OTHER): Payer: BC Managed Care – PPO | Admitting: Neurology

## 2019-09-24 DIAGNOSIS — G43711 Chronic migraine without aura, intractable, with status migrainosus: Secondary | ICD-10-CM | POA: Diagnosis not present

## 2019-09-24 NOTE — Progress Notes (Signed)
Consent Form Botulism Toxin Injection For Chronic Migraine  Interval history 09/24/2019: Not on cgrp. Baseline was 20 headache days a month and 15 migraine days a month. (tried Aimovig with constipation, tried emgality did not tolerate), failed multiple classes of meds and has side effects to many medications).  She has done exceptionally well. She is NOT on cgrp. With botox she may have 1 migraine day a month or less > 99% reduction in frequency. Imitrex works but causing side effects - side effects have resolved, +o, .   She felt like her forehead was heavy we changed. and used 15 extra units at the upper cervical paraspinals and occipitalis areas.    also she can take alleve with the imitrex if needed she can try that.  She is in menopause and has irregular periods. Imitrex is working, I am happy to increase Prozac to 40mg  if needed. I gave her 3 samples of ajovy will see what she wants to do with botox vs ajovy.  Consent Form Botulism Toxin Injection For Chronic Migraine    Reviewed orally with patient, additionally signature is on file:  Botulism toxin has been approved by the Federal drug administration for treatment of chronic migraine. Botulism toxin does not cure chronic migraine and it may not be effective in some patients.  The administration of botulism toxin is accomplished by injecting a small amount of toxin into the muscles of the neck and head. Dosage must be titrated for each individual. Any benefits resulting from botulism toxin tend to wear off after 3 months with a repeat injection required if benefit is to be maintained. Injections are usually done every 3-4 months with maximum effect peak achieved by about 2 or 3 weeks. Botulism toxin is expensive and you should be sure of what costs you will incur resulting from the injection.  The side effects of botulism toxin use for chronic migraine may include:   -Transient, and usually mild, facial weakness with facial  injections  -Transient, and usually mild, head or neck weakness with head/neck injections  -Reduction or loss of forehead facial animation due to forehead muscle weakness  -Eyelid drooping  -Dry eye  -Pain at the site of injection or bruising at the site of injection  -Double vision  -Potential unknown long term risks  Contraindications: You should not have Botox if you are pregnant, nursing, allergic to albumin, have an infection, skin condition, or muscle weakness at the site of the injection, or have myasthenia gravis, Lambert-Eaton syndrome, or ALS.  It is also possible that as with any injection, there may be an allergic reaction or no effect from the medication. Reduced effectiveness after repeated injections is sometimes seen and rarely infection at the injection site may occur. All care will be taken to prevent these side effects. If therapy is given over a long time, atrophy and wasting in the muscle injected may occur. Occasionally the patient's become refractory to treatment because they develop antibodies to the toxin. In this event, therapy needs to be modified.  I have read the above information and consent to the administration of botulism toxin.    BOTOX PROCEDURE NOTE FOR MIGRAINE HEADACHE    Contraindications and precautions discussed with patient(above). Aseptic procedure was observed and patient tolerated procedure. Procedure performed by Dr.  The condition has existed for more than 6 months, and pt does not have a diagnosis of ALS, Myasthenia Gravis or Lambert-Eaton Syndrome.  Risks and benefits of injections discussed and pt  agrees to proceed with the procedure.  Written consent obtained  These injections are medically necessary. Pt  receives good benefits from these injections. These injections do not cause sedations or hallucinations which the oral therapies may cause.  Description of procedure:  The patient was placed in a sitting position. The  standard protocol was used for Botox as follows, with 5 units of Botox injected at each site:   -Procerus muscle, midline injection  -Corrugator muscle, bilateral injection  -Frontalis muscle, bilateral injection, with 2 sites each side, medial injection was performed in the upper one third of the frontalis muscle, in the region vertical from the medial inferior edge of the superior orbital rim. The lateral injection was again in the upper one third of the forehead vertically above the lateral limbus of the cornea, 1.5 cm lateral to the medial injection site.  -Temporalis muscle injection, 4 sites, bilaterally. The first injection was 3 cm above the tragus of the ear, second injection site was 1.5 cm to 3 cm up from the first injection site in line with the tragus of the ear. The third injection site was 1.5-3 cm forward between the first 2 injection sites. The fourth injection site was 1.5 cm posterior to the second injection site.   -Occipitalis muscle injection, 3 sites, bilaterally. The first injection was done one half way between the occipital protuberance and the tip of the mastoid process behind the ear. The second injection site was done lateral and superior to the first, 1 fingerbreadth from the first injection. The third injection site was 1 fingerbreadth superiorly and medially from the first injection site.  -Cervical paraspinal muscle injection, 2 sites, bilateral knee first injection site was 1 cm from the midline of the cervical spine, 3 cm inferior to the lower border of the occipital protuberance. The second injection site was 1.5 cm superiorly and laterally to the first injection site.  -Trapezius muscle injection was performed at 3 sites, bilaterally. The first injection site was in the upper trapezius muscle halfway between the inflection point of the neck, and the acromion. The second injection site was one half way between the acromion and the first injection site. The third  injection was done between the first injection site and the inflection point of the neck.   Will return for repeat injection in 3 months.   200 units of Botox was used, any Botox not injected was wasted. The patient tolerated the procedure well, there were no complications of the above procedure.

## 2019-09-24 NOTE — Progress Notes (Signed)
Botox- 200 units x 1 vial Lot: [id number] Expiration: 05/2022 NDC: 0023-3921-02  Bacteriostatic 0.9% Sodium Chloride- 4mL total Lot: [id number] Expiration: 11/28/2020 NDC: 0409-1966-02  Dx: G43.711 B/B  

## 2019-11-06 ENCOUNTER — Telehealth: Payer: Self-pay | Admitting: *Deleted

## 2019-11-06 MED ORDER — KETOROLAC TROMETHAMINE 60 MG/2ML IM SOLN: INTRAMUSCULAR | 0 refills | Status: DC

## 2019-11-06 NOTE — Telephone Encounter (Signed)
Pt spoke with Dr Lucia Gaskins and called into office reporting migraine resistant to her medications which is unusual for her. She would like to try Toradol per Dr Lucia Gaskins and prefers to administer at home. Pt educated on proper administration, hand hygiene, and injection sites she can use. Per Dr Lucia Gaskins, Toradol 60 mg IM prescription [facility]. Pt aware of instructions. She verbalized appreciation.

## 2019-11-28 ENCOUNTER — Other Ambulatory Visit: Payer: Self-pay | Admitting: Neurology

## 2019-12-17 ENCOUNTER — Telehealth: Payer: Self-pay | Admitting: *Deleted

## 2019-12-17 NOTE — Telephone Encounter (Signed)
I called the pt and let her know insurance would not cover both. The pt would like to see if she can skip the next Botox and try Ajovy alone. I let her know I would present this to Dr Lucia Gaskins and see what she advises. Pt aware that Dr Lucia Gaskins is out of the office at this time but will discuss with her when she gets back. She verbalized appreciation.

## 2019-12-17 NOTE — Telephone Encounter (Signed)
[employer] will not pay for both.

## 2019-12-17 NOTE — Telephone Encounter (Signed)
We received a PA request for Ubrelvy. I called the pt. She is not taking the Ubrelvy so a PA will not be done. Pt stated the Ajovy is working well for her. She is curious if the Ajovy can be covered by insurance. I let her know even if it's not covered there is a savings card. Patient would like to see if Ladona Ridgel can find out if insurance will pay for both.

## 2019-12-18 MED ORDER — AJOVY 225 MG/1.5ML ~~LOC~~ SOAJ: 225.0000 mg | SUBCUTANEOUS | 2 refills | Status: DC

## 2019-12-18 NOTE — Addendum Note (Signed)
Addended by: Bertram Savin on: 12/18/2019 10:00 AM   Modules accepted: Orders

## 2019-12-18 NOTE — Telephone Encounter (Signed)
Totally fine, thanks

## 2019-12-18 NOTE — Telephone Encounter (Addendum)
Noted  

## 2019-12-18 NOTE — Telephone Encounter (Signed)
Spoke with patient and discussed that Dr Lucia Gaskins is perfectly fine with trying Ajovy alone for a few months and skip a round of botox. I discussed Ajovy will be sent to her pharmacy and we discussed the savings offer so pt can get this for $5 even if insurance doesn't cover. Pt verbalized appreciation.   Ajovy 225 mg q30 days SQ sent to pharmacy with 2 refills.

## 2019-12-31 ENCOUNTER — Ambulatory Visit: Payer: BC Managed Care – PPO | Admitting: Neurology

## 2019-12-31 ENCOUNTER — Telehealth: Payer: Self-pay | Admitting: *Deleted

## 2019-12-31 NOTE — Telephone Encounter (Signed)
Completed Ajovy PA on Cover My Meds. Key: [id number]. [employer] approved. Case Id: [id number]; Status: Approved; Review Type: Prior Auth; Coverage Start Date:12/01/2019; Coverage End Date:12/30/2020;   I faxed approval dates to pharmacy. Received a receipt of confirmation. I called the pt and let her know via VM (ok per DPR). Also reminded pt of $5 savings offer. Left office number for questions if needed.

## 2020-01-22 ENCOUNTER — Other Ambulatory Visit: Payer: Self-pay | Admitting: *Deleted

## 2020-01-22 ENCOUNTER — Other Ambulatory Visit: Payer: Self-pay | Admitting: Neurology

## 2020-01-30 IMAGING — RF DG UGI W/ HIGH DENSITY W/KUB
8 series · 14 of 24 positions shown · non-contrast
Comparison: None.

CLINICAL DATA: Intermittent epigastric pain.

EXAM:
UPPER GI SERIES WITH KUB
TECHNIQUE: After obtaining a scout radiograph a routine upper GI series was
performed using thin and high density barium.
FLUOROSCOPY TIME:  Fluoroscopy Time:  1 minutes, 42 seconds.
Radiation Exposure Index (if provided by the fluoroscopic device):
23 mGy.
Number of Acquired Spot Images: 1

[Series 1: one shot · 0.14mm/px · 1 of 2 slices shown (1 of 3)]
[im 1/2]
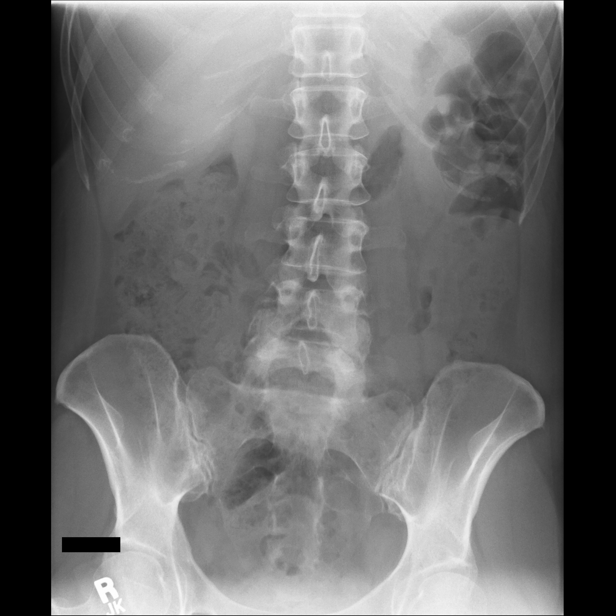

[Series 2: sequence · 2 of 15 frames shown (1 of 5)]
[frame 1/15]
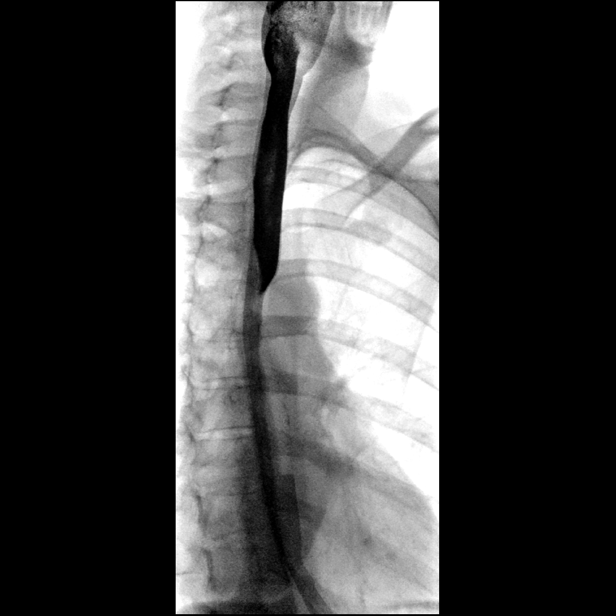
[frame 13/15]
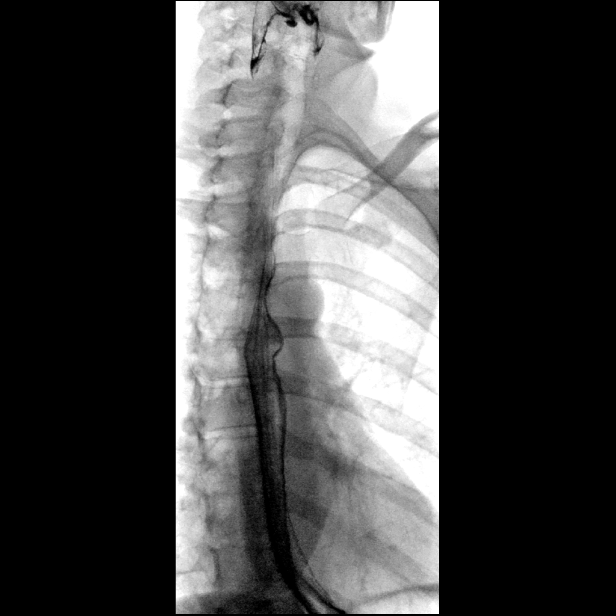

[Series 3: sequence · 2 of 24 frames shown (2 of 5)]
[frame 4/24]
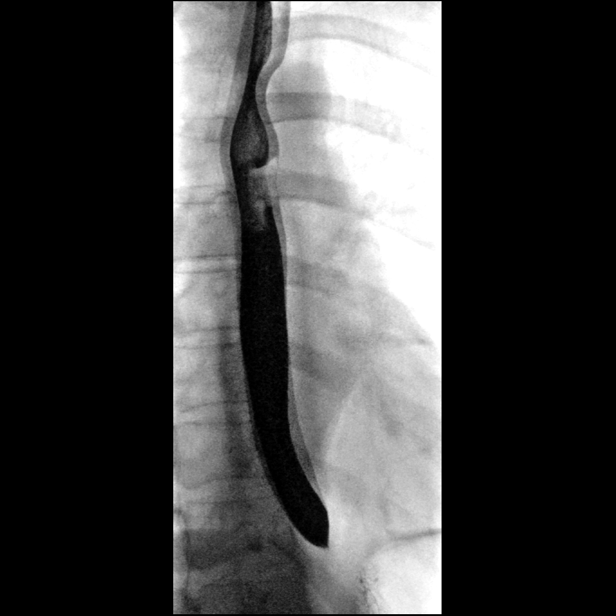
[frame 21/24]
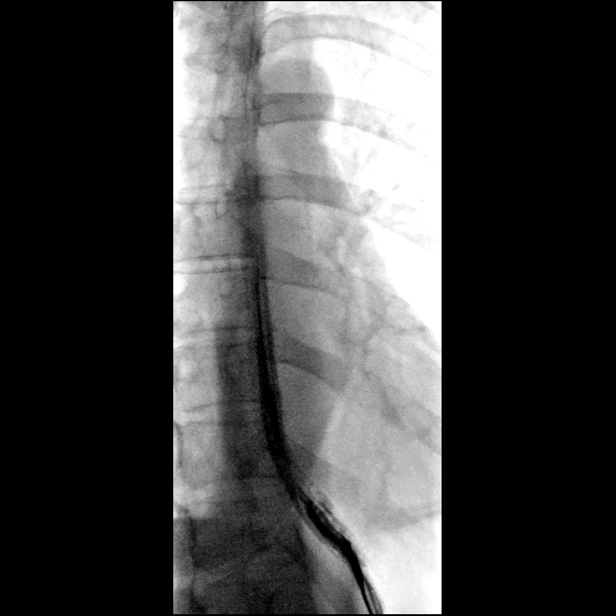

[Series 4: one shot · 2 of 5 slices shown (2 of 3)]
[im 2/5]
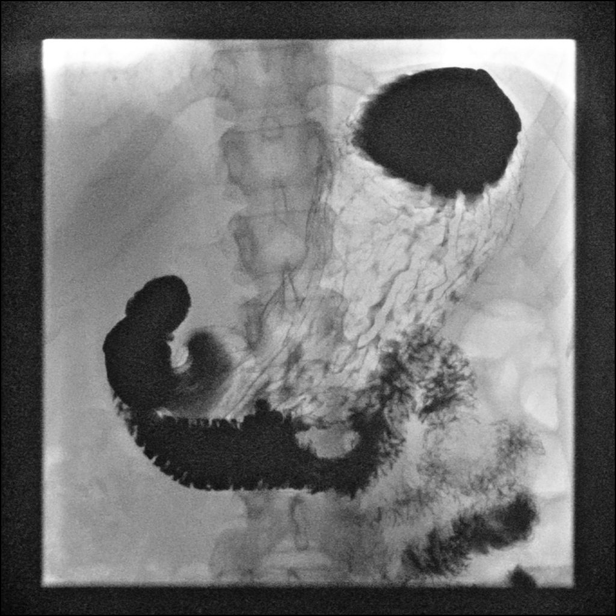
[im 4/5]
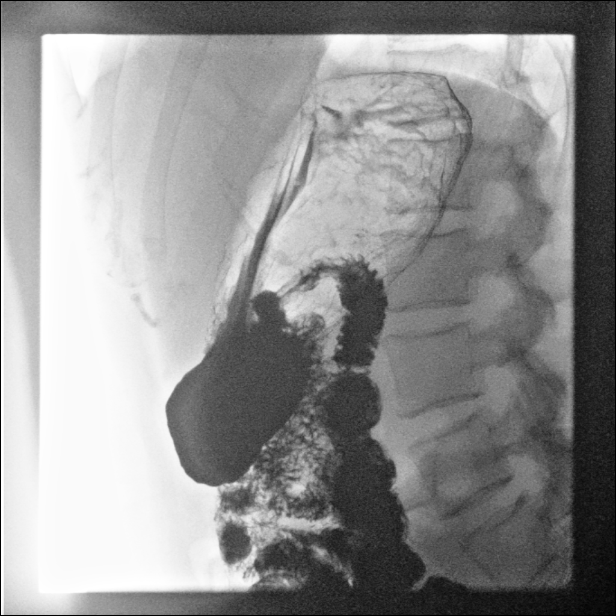

[Series 5: sequence · 2 of 28 frames shown (3 of 5)]
[frame 5/28]
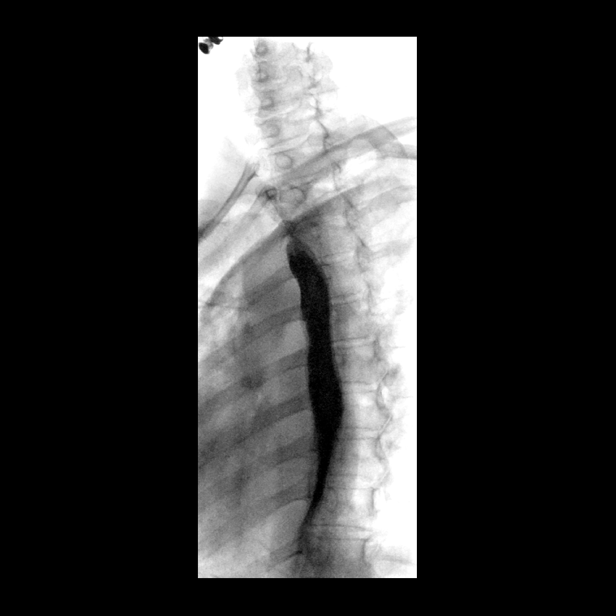
[frame 17/28]
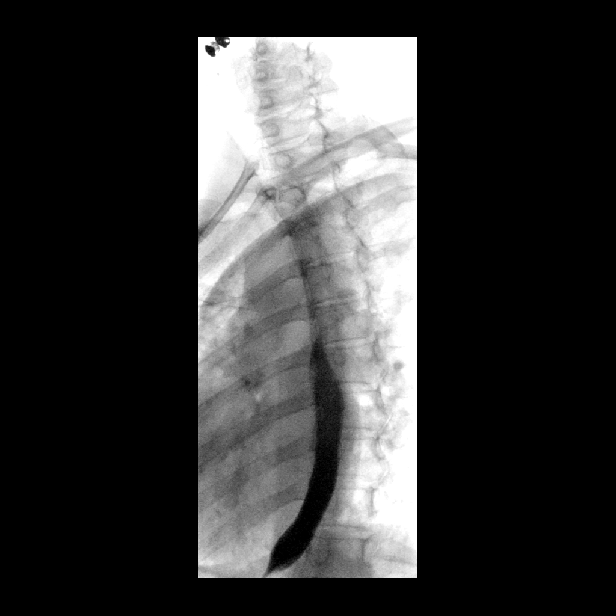

[Series 6: sequence · 2 of 31 frames shown (4 of 5)]
[frame 5/31]
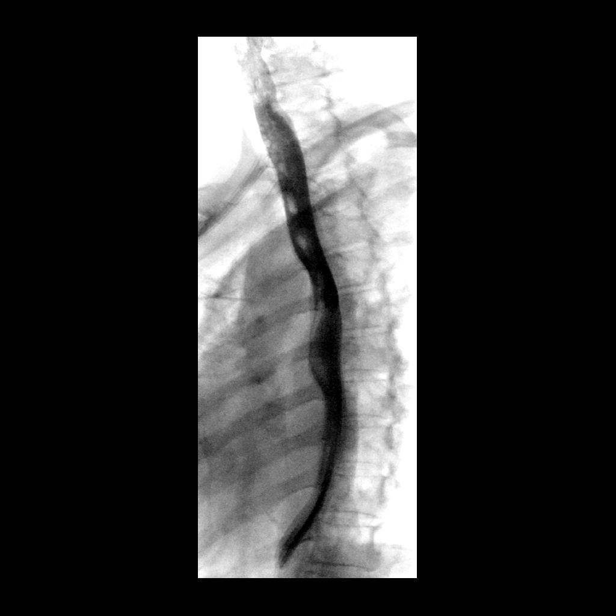
[frame 27/31]
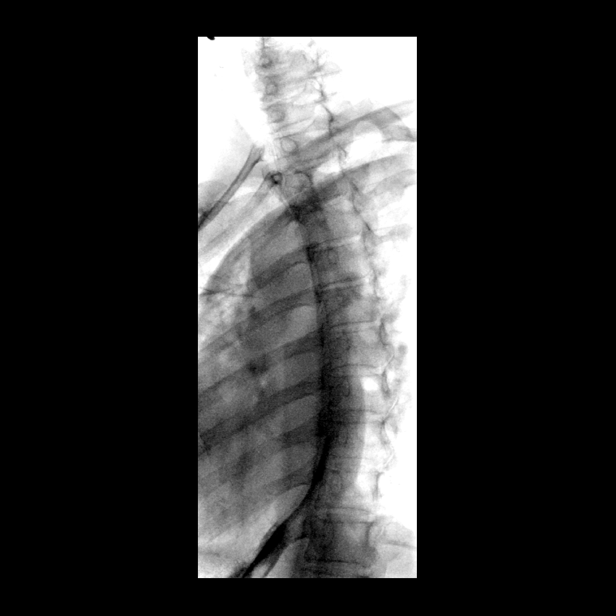

[Series 7: sequence · 2 of 41 frames shown (5 of 5)]
[frame 1/41]
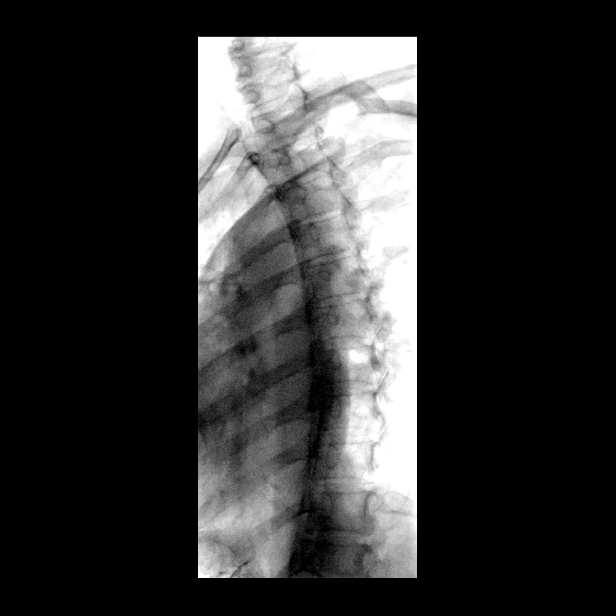
[frame 35/41]
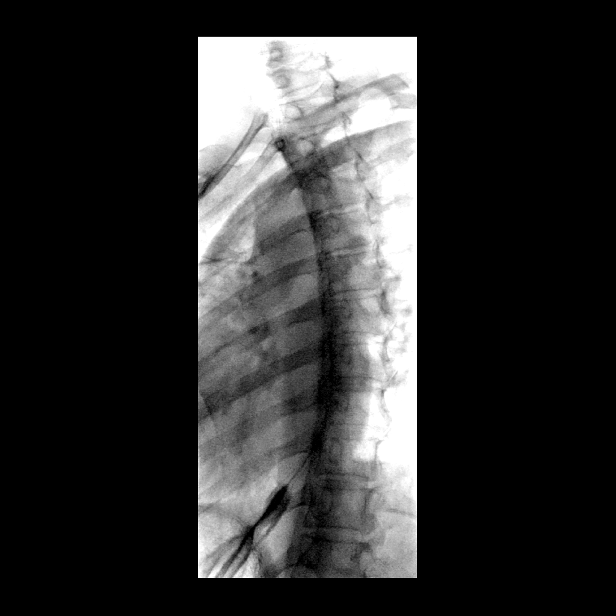

[Series 8: one shot · 1 of 2 slices shown (3 of 3)]
[im 2/2]
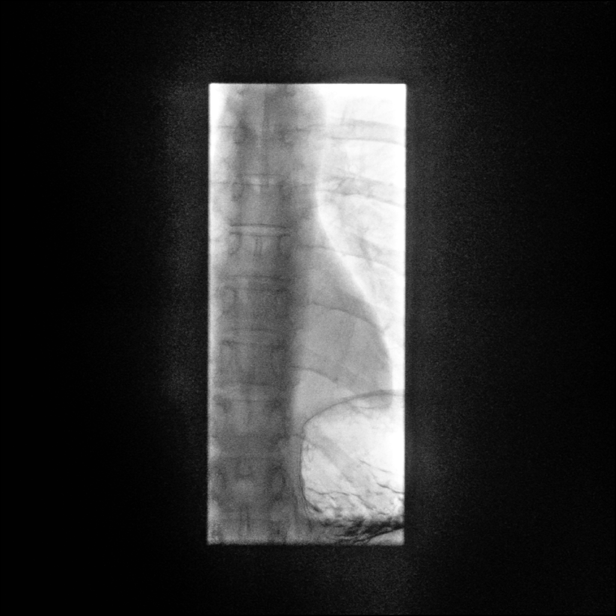

[14 of 24 positions shown; findings below may reference images not displayed]

FINDINGS: Preliminary KUB demonstrates a nonobstructive bowel gas pattern. No
calcifications or other focal radiographic abnormality.

The pharyngeal phase of swallowing was normal.

Primary peristaltic waves in the esophagus were normal. No
esophageal stricture, ulceration, or other significant abnormality.
No gastroesophageal reflux was noted during the course of the
examination. No hiatal hernia.

Gastric morphology and appearance appear normal. Proximal duodenum
appears normal.

A 13 mm barium tablet passed without difficulty into the stomach.
IMPRESSION: Normal upper GI.

## 2021-02-27 ENCOUNTER — Other Ambulatory Visit: Payer: Self-pay | Admitting: Neurology

## 2021-04-07 ENCOUNTER — Other Ambulatory Visit: Payer: Self-pay | Admitting: Neurology

## 2021-04-14 ENCOUNTER — Telehealth: Payer: Self-pay | Admitting: Neurology

## 2021-04-14 MED ORDER — SUMATRIPTAN SUCCINATE 100 MG PO TABS: ORAL_TABLET | ORAL | 0 refills | Status: DC

## 2021-04-14 NOTE — Telephone Encounter (Signed)
Pt is requesting a refill for SUMAtriptan (IMITREX) 100 MG tablet.  [phone]  Pt has scheduled her annual f/u

## 2021-04-14 NOTE — Telephone Encounter (Signed)
Ok, one refill [facility] to [phone].

## 2021-05-11 ENCOUNTER — Other Ambulatory Visit: Payer: Self-pay | Admitting: Neurology

## 2021-05-12 ENCOUNTER — Encounter: Payer: Self-pay | Admitting: *Deleted

## 2021-05-12 ENCOUNTER — Telehealth: Payer: BC Managed Care – PPO | Admitting: Neurology

## 2021-05-12 DIAGNOSIS — G43709 Chronic migraine without aura, not intractable, without status migrainosus: Secondary | ICD-10-CM

## 2021-05-12 NOTE — Progress Notes (Signed)
?[facility] ? ? ? ?Provider:  Dr Lucia Gaskins ?Referring Provider: Richardean Chimera, MD ?Primary Care Physician:  Richardean Chimera, MD ? ?CC:  Migraines and neck pain ? ?Virtual Visit via Video Note ? ?I connected with Norval Gable on 05/12/2021 at  8:30 AM EDT by a video enabled telemedicine application and verified that I am speaking with the correct person using two identifiers. ? ?Location: ?Patient: home ?Provider: office ?  ?I discussed the limitations of evaluation and management by telemedicine and the availability of in person appointments. The patient expressed understanding and agreed to proceed. ? ? ?Follow Up Instructions: ? ?  ?I discussed the assessment and treatment plan with the patient. The patient was provided an opportunity to ask questions and all were answered. The patient agreed with the plan and demonstrated an understanding of the instructions. ?  ?The patient was advised to call back or seek an in-person evaluation if the symptoms worsen or if the condition fails to improve as anticipated. ? ?I provided 20 minutes of non-face-to-face time during this encounter. ? ? ?Anson Fret, MD ? ? ?05/12/2021: So after the last botox 08/2019 the migraines significantly improved (less stress at work) and would have sporadic migraines. 3 years ago she was in menopause, maybe that is why it improved. Now she gets migraines every 2 weeks. They may last 1 day or 3-4 days. She can feel it coming. Imitrex helps but she is afraid she won't get to it. We discussed several options. Continue current medications. Start Topiramate. She has 4 migraine days a month and < 14 total headache days. ? ?Patient complains of symptoms per HPI as well as the following symptoms: headaches . Pertinent negatives and positives per HPI. All others negative ? ? ?Interval history 09/24/2019: Not on cgrp. Baseline was 20 headache days a month and 15 migraine days a month. (tried Aimovig with constipation, tried emgality did  not tolerate), failed multiple classes of meds and has side effects to many medications).  She has done exceptionally well. She is NOT on cgrp. With botox she may have 1 migraine day a month or less > 99% reduction in frequency. Imitrex works but causing side effects - side effects have resolved, +o, .   She felt like her forehead was heavy we changed. and used 15 extra units at the upper cervical paraspinals and occipitalis areas.  ? ? also she can take alleve with the imitrex if needed she can try that.  She is in menopause and has irregular periods. Imitrex is working, I am happy to increase Prozac to 40mg  if needed. I gave her 3 samples of ajovy will see what she wants to do with botox vs ajovy. ? ?Interval history:  Migraines are worsening.  Topiramate samples had side effects.  Ws on prozac in the past.  B-blockers contraindicated.  No medication overuse and no aura.  She has 20 headache days a month and 15 her migraines this is been ongoing for over 6 months. Will try emgality and in 3 months try again for botox.  Migraines are severe or moderately severe. +photo/phonophobia, +nausea, pounding/throbbing, movement makes it worse, they can last 24-72 hours. Aimovig with constipation ? ?Interval history: Migraines are worsening.  Topiramate samples had side effects.  Ws on prozac in the past.  B-blockers contraindicated.  No medication overuse and no aura.  She has 20 headache days a month and 15 her migraines this is been ongoing for over 4 months.  She has  been on multiple medications in the past including Prozac, we gave her multiple samples last time of topiramate and patient had significant cognitive impairment, will restart Prozac at this time, beta-blockers are contraindicated due to very low blood pressure.  Recommend Botox at this time. Migraines can last up to 24 hours and be severe. Cambia made her throw up. ?  ?HPI:  Catrina Fellenz is a 51 y.o. female here as a referral from Dr. Arelia Sneddon for migraines  and neck pain. Started years ago and sporadic, once every 6 months. In the last year she has a cluster, lasts 4-5 days. Maybe around periods, unclear because her periods are not regular may may premenopausal. She has 16 headache days a month and 8 headache days a month that are migraines. Every month cluster of 4-5 days of refractory migraines. Migraines are on the left, the most uncomfortable pain, skin sensitivity around the left eye, pulsating, unilateral, light sensitivity, nausea. She takes imitrex but it take 90 minutes to work. Imitrex works, it takes time. No other OTC medications. No aura. No medication overuse. Worsening over the last year. The ramp up is slow. She has neck pain, started in June/July, stiffness, decreased ROM and the right arm hurts. She has muscle tightness in the cervical paraspinals. If she sneezes the headache worsens and the neck pain worsens. Headache is positional. Never had imaging of her brain ir neck. Imitrex makes her head feel heavy and foggy. >!5 headache days a month.No other focal neurologic deficits, associated symptoms, inciting events or modifiable factors. ?  ?Meds tried: Prozac, topiramate(side effects), Maxalt (horrible), Imitrex (works but makes her groggy), prozac, b-blockers contraindicated due to low BP ?  ?Reviewed notes, labs and imaging from outside physicians, which showed: ?  ?Reviewed notes, her cycles are every 2-3 weeks and to 3 days of flow, not heavy, some distress and Aredia, sexual activity with no discomfort, previous bilateral tubal ligations. Has had problems with migraine headaches seem to cluster around her periods. Does have some mild stress incontinence. No change in bowel habits. May be perimenopausal anovulatory cycling. Exam was negative. Diagnosed with migraine headaches. ?  ?CMP normal 09/2014 ? ? ?Social History  ? ?Socioeconomic History  ? Marital status: Married  ?  Spouse name: Not on file  ? Number of children: 2  ? Years of education:  Not on file  ? Highest education level: Some college, no degree  ?Occupational History  ? Occupation: Land  ?  Comment: [employer]  ?Tobacco Use  ? Smoking status: Former  ?  Types: Cigarettes  ?  Quit date: 02/28/2017  ?  Years since quitting: 4.2  ? Smokeless tobacco: Never  ? Tobacco comments:  ?  5 cig a day  ?Vaping Use  ? Vaping Use: Never used  ?Substance and Sexual Activity  ? Alcohol use: No  ? Drug use: No  ? Sexual activity: Yes  ?  Birth control/protection: None  ?Other Topics Concern  ? Not on file  ?Social History Narrative  ? Lives at home with husband and daughters  ? Right handed  ? Caffeine 1 drink daily  ? ?Social Determinants of Health  ? ?Financial Resource Strain: Not on file  ?Food Insecurity: Not on file  ?Transportation Needs: Not on file  ?Physical Activity: Not on file  ?Stress: Not on file  ?Social Connections: Not on file  ?Intimate Partner Violence: Not on file  ? ? ?No family history on file. ? ?Past Medical History:  ?  Diagnosis Date  ? Chronic migraine without aura, with status migrainosus   ? ? ?Past Surgical History:  ?Procedure Laterality Date  ? TUBAL LIGATION    ? ? ?Current Outpatient Medications  ?Medication Sig Dispense Refill  ? topiramate (TOPAMAX) 50 MG tablet Start with 1 tab at bedtime(50mg ) then increase to 2 tabs ( ) as tolerated in 1-2 weeks. 180 tablet 3  ? FLUoxetine (PROZAC) 20 MG capsule Take 1 capsule (20 mg total) by mouth daily. 90 capsule 4  ? Fremanezumab-vfrm (AJOVY) 225 MG/1.5ML SOAJ Inject 225 mg into the skin every 30 (thirty) days. 1.5 mL 11  ? ketorolac (TORADOL) 60 MG/2ML SOLN injection INJECT 2 ML (60 MG) INTO THE MUSCLE FOR MIGRAINE. MAY REPEAT IN 6 HOURS IF NEEDED. DO NOT USE MORE THAN 4 DAYS PER MONTH. MAX OF 120 MG (4 ML) PER DAY. 8 mL 0  ? LINZESS 290 MCG CAPS capsule Take 290 mg by mouth 2 (two) times a week.  5  ? Rimegepant Sulfate (NURTEC) 75 MG TBDP Take 75 mg by mouth daily as needed. For migraines. Take as close to onset  of migraine as possible. One daily maximum. 16 tablet 11  ? SUMAtriptan (IMITREX) 100 MG tablet May repeat in 2 hours if headache persists or recurs. 12 tablet 11  ? tiZANidine (ZANAFLEX) 4 MG capsule

## 2021-05-13 ENCOUNTER — Telehealth: Payer: Self-pay | Admitting: *Deleted

## 2021-05-13 ENCOUNTER — Encounter: Payer: Self-pay | Admitting: Neurology

## 2021-05-13 MED ORDER — NURTEC 75 MG PO TBDP: 75.0000 mg | ORAL_TABLET | Freq: Every day | ORAL | 11 refills | Status: DC | PRN

## 2021-05-13 MED ORDER — AJOVY 225 MG/1.5ML ~~LOC~~ SOAJ: 225.0000 mg | SUBCUTANEOUS | 11 refills | Status: DC

## 2021-05-13 MED ORDER — FLUOXETINE HCL 20 MG PO CAPS: 20.0000 mg | ORAL_CAPSULE | Freq: Every day | ORAL | 4 refills | Status: DC

## 2021-05-13 MED ORDER — SUMATRIPTAN SUCCINATE 100 MG PO TABS: ORAL_TABLET | ORAL | 11 refills | Status: DC

## 2021-05-13 NOTE — Telephone Encounter (Signed)
Completed Nurtec PA on Cover My Meds. Key: [id number]. Awaiting determination from [facility].  ?

## 2021-05-13 NOTE — Telephone Encounter (Signed)
Completed Ajovy PA on Cover My Meds. Key: [id number]. Awaiting determination from [facility].  ?

## 2021-05-14 ENCOUNTER — Encounter: Payer: Self-pay | Admitting: *Deleted

## 2021-05-14 NOTE — Telephone Encounter (Signed)
Received Ajovy approval from [facility]. Medication approved  05/13/2021 to 08/11/2021. Faxed notice to pharmacy. Received a receipt of confirmation. ?

## 2021-05-14 NOTE — Telephone Encounter (Signed)
Received Nurtec approval from [facility]. Medication approved  05/13/2021 to 05/13/2022. Faxed notice to pharmacy. Received a receipt of confirmation. ? ?

## 2021-05-15 MED ORDER — TOPIRAMATE 50 MG PO TABS: ORAL_TABLET | ORAL | 3 refills | Status: DC

## 2021-06-14 ENCOUNTER — Other Ambulatory Visit: Payer: Self-pay | Admitting: Neurology

## 2022-04-14 ENCOUNTER — Encounter: Payer: Self-pay | Admitting: *Deleted

## 2022-05-15 ENCOUNTER — Other Ambulatory Visit: Payer: Self-pay | Admitting: Neurology

## 2022-05-15 DIAGNOSIS — G43709 Chronic migraine without aura, not intractable, without status migrainosus: Secondary | ICD-10-CM

## 2022-06-06 ENCOUNTER — Ambulatory Visit: Payer: BC Managed Care – PPO | Admitting: Neurology

## 2022-06-06 ENCOUNTER — Encounter: Payer: Self-pay | Admitting: Neurology

## 2022-06-06 VITALS — BP 101/58 | HR 52 | Ht 64.0 in | Wt 128.6 lb

## 2022-06-06 DIAGNOSIS — G43009 Migraine without aura, not intractable, without status migrainosus: Secondary | ICD-10-CM

## 2022-06-06 MED ORDER — NURTEC 75 MG PO TBDP: 75.0000 mg | ORAL_TABLET | Freq: Every day | ORAL | 11 refills | Status: AC | PRN

## 2022-06-06 MED ORDER — SUMATRIPTAN SUCCINATE 100 MG PO TABS: ORAL_TABLET | ORAL | 11 refills | Status: AC

## 2022-06-06 NOTE — Progress Notes (Signed)
[facility]    Provider:  Dr Lucia Gaskins Referring Provider: Richardean Chimera, MD Primary Care Physician:  Richardean Chimera, MD  CC:  Migraines and neck pain  06/06/2022: 4-5 migraines a month.  < 10 total headache days a month lasy tear. Doesn't need topamax or ajovy. Sometimes takes with imitrex. Discussed other options. Doing great. Not taking any injections. She just uses nurtec.   Patient complains of symptoms per HPI as well as the following symptoms: migraines . Pertinent negatives and positives per HPI. All others negative   05/12/2021: So after the last botox 08/2019 the migraines significantly improved (less stress at work) and would have sporadic migraines. 3 years ago she was in menopause, maybe that is why it improved. Now she gets migraines every 2 weeks. They may last 1 day or 3-4 days. She can feel it coming. Imitrex helps but she is afraid she won't get to it. We discussed several options. Continue current medications. Start Topiramate. She has 4 migraine days a month and < 14 total headache days.  Patient complains of symptoms per HPI as well as the following symptoms: headaches . Pertinent negatives and positives per HPI. All others negative   Interval history 09/24/2019: Not on cgrp. Baseline was 20 headache days a month and 15 migraine days a month. (tried Aimovig with constipation, tried emgality did not tolerate), failed multiple classes of meds and has side effects to many medications).  She has done exceptionally well. She is NOT on cgrp. With botox she may have 1 migraine day a month or less > 99% reduction in frequency. Imitrex works but causing side effects - side effects have resolved, +o, .   She felt like her forehead was heavy we changed. and used 15 extra units at the upper cervical paraspinals and occipitalis areas.    also she can take alleve with the imitrex if needed she can try that.  She is in menopause and has irregular periods. Imitrex is working, I am  happy to increase Prozac to 40mg  if needed. I gave her 3 samples of ajovy will see what she wants to do with botox vs ajovy.  Interval history:  Migraines are worsening.  Topiramate samples had side effects.  Ws on prozac in the past.  B-blockers contraindicated.  No medication overuse and no aura.  She has 20 headache days a month and 15 her migraines this is been ongoing for over 6 months. Will try emgality and in 3 months try again for botox.  Migraines are severe or moderately severe. +photo/phonophobia, +nausea, pounding/throbbing, movement makes it worse, they can last 24-72 hours. Aimovig with constipation  Interval history: Migraines are worsening.  Topiramate samples had side effects.  Ws on prozac in the past.  B-blockers contraindicated.  No medication overuse and no aura.  She has 20 headache days a month and 15 her migraines this is been ongoing for over 4 months.  She has been on multiple medications in the past including Prozac, we gave her multiple samples last time of topiramate and patient had significant cognitive impairment, will restart Prozac at this time, beta-blockers are contraindicated due to very low blood pressure.  Recommend Botox at this time. Migraines can last up to 24 hours and be severe. Cambia made her throw up.   HPI:  Carol Mckinney is a 52 y.o. female here as a referral from Dr. Arelia Sneddon for migraines and neck pain. Started years ago and sporadic, once every 6 months. In the last year  she has a cluster, lasts 4-5 days. Maybe around periods, unclear because her periods are not regular may may premenopausal. She has 16 headache days a month and 8 headache days a month that are migraines. Every month cluster of 4-5 days of refractory migraines. Migraines are on the left, the most uncomfortable pain, skin sensitivity around the left eye, pulsating, unilateral, light sensitivity, nausea. She takes imitrex but it take 90 minutes to work. Imitrex works, it takes time. No other OTC  medications. No aura. No medication overuse. Worsening over the last year. The ramp up is slow. She has neck pain, started in June/July, stiffness, decreased ROM and the right arm hurts. She has muscle tightness in the cervical paraspinals. If she sneezes the headache worsens and the neck pain worsens. Headache is positional. Never had imaging of her brain ir neck. Imitrex makes her head feel heavy and foggy. >!5 headache days a month.No other focal neurologic deficits, associated symptoms, inciting events or modifiable factors.   Meds tried: Prozac, topiramate(side effects), Maxalt (horrible), Imitrex (works but makes her groggy), prozac, b-blockers contraindicated due to low BP   Reviewed notes, labs and imaging from outside physicians, which showed:   Reviewed notes, her cycles are every 2-3 weeks and to 3 days of flow, not heavy, some distress and Aredia, sexual activity with no discomfort, previous bilateral tubal ligations. Has had problems with migraine headaches seem to cluster around her periods. Does have some mild stress incontinence. No change in bowel habits. May be perimenopausal anovulatory cycling. Exam was negative. Diagnosed with migraine headaches.   CMP normal 09/2014   Social History   Socioeconomic History   Marital status: Married    Spouse name: Not on file   Number of children: 2   Years of education: Not on file   Highest education level: Some college, no degree  Occupational History   Occupation: Land    Comment: [employer]  Tobacco Use   Smoking status: Former    Types: Cigarettes    Quit date: 02/28/2017    Years since quitting: 5.2   Smokeless tobacco: Never   Tobacco comments:    5 cig a day  Vaping Use   Vaping Use: Never used  Substance and Sexual Activity   Alcohol use: No   Drug use: No   Sexual activity: Yes    Birth control/protection: None  Other Topics Concern   Not on file  Social History Narrative   Lives at home with  husband and daughters   Right handed   Caffeine 1 drink daily   Social Determinants of Health   Financial Resource Strain: Not on file  Food Insecurity: Not on file  Transportation Needs: Not on file  Physical Activity: Not on file  Stress: Not on file  Social Connections: Not on file  Intimate Partner Violence: Not on file    History reviewed. No pertinent family history.  Past Medical History:  Diagnosis Date   Chronic migraine without aura, with status migrainosus     Past Surgical History:  Procedure Laterality Date   TUBAL LIGATION      Current Outpatient Medications  Medication Sig Dispense Refill   FLUoxetine (PROZAC) 20 MG capsule TAKE 1 CAPSULE BY MOUTH EVERY DAY 90 capsule 4   LINZESS 290 MCG CAPS capsule Take 290 mg by mouth 2 (two) times a week.  5   Rimegepant Sulfate (NURTEC) 75 MG TBDP Take 1 tablet (75 mg total) by mouth daily as needed.  For migraines. Take as close to onset of migraine as possible. One daily maximum. 16 tablet 11   SUMAtriptan (IMITREX) 100 MG tablet May repeat in 2 hours if headache persists or recurs. 12 tablet 11   No current facility-administered medications for this visit.   Facility-Administered Medications Ordered in Other Visits  Medication Dose Route Frequency Provider Last Rate Last Admin   gadopentetate dimeglumine (MAGNEVIST) injection 11 mL  11 mL Intravenous Once PRN Anson Fret, MD        Allergies as of 06/06/2022 - Review Complete 06/06/2022  Allergen Reaction Noted   Cambia [diclofenac potassium(migraine)]  10/11/2017   Topiramate er  10/11/2017   Zonisamide  10/11/2017    Vitals: BP (!) 101/58   Pulse (!) 52   Ht 5\' 4"  (1.626 m)   Wt 128 lb 9.6 oz (58.3 kg)   BMI 22.07 kg/m  Last Weight:  Wt Readings from Last 1 Encounters:  06/06/22 128 lb 9.6 oz (58.3 kg)   Last Height:   Ht Readings from Last 1 Encounters:  06/06/22 5\' 4"  (1.626 m)   Exam: NAD, pleasant                  Speech:    Speech is  normal; fluent and spontaneous with normal comprehension.  Cognition:    The patient is oriented to person, place, and time;     recent and remote memory intact;     language fluent;    Cranial Nerves:    The pupils are equal, round, and reactive to light.Trigeminal sensation is intact and the muscles of mastication are normal. The face is symmetric. The palate elevates in the midline. Hearing intact. Voice is normal. Shoulder shrug is normal. The tongue has normal motion without fasciculations.   Coordination:  No dysmetria  Motor Observation:    No asymmetry, no atrophy, and no involuntary movements noted. Tone:    Normal muscle tone.     Strength:    Strength is V/V in the upper and lower limbs.      Sensation: intact to LT   Assessment/Plan:  06/06/2022: 4-5 migraines a month.  < 10 total headache days a month lasy tear. Doesn't need topamax or ajovy. Sometimes takes with imitrex. Discussed other options. Doing great. Not taking any injections. She just uses nurtec. Tried imitrex, rizatriptan, zomig  Hormonal fluctuations of migraine: Working with OBGYn about birth control which may help  Meds ordered this encounter  Medications   Rimegepant Sulfate (NURTEC) 75 MG TBDP    Sig: Take 1 tablet (75 mg total) by mouth daily as needed. For migraines. Take as close to onset of migraine as possible. One daily maximum.    Dispense:  16 tablet    Refill:  11    Patient has copay card; she can have medication regardless of insurance approval or copay amount.   SUMAtriptan (IMITREX) 100 MG tablet    Sig: May repeat in 2 hours if headache persists or recurs.    Dispense:  12 tablet    Refill:  11     Naomie Dean, MD  [facility] [address] [place], [place] [address]  Phone [phone] Fax [phone]  I spent over 10 minutes of face-to-face and non-face-to-face time with patient on the  1. Migraine without aura and without status  migrainosus, not intractable    diagnosis.  This included previsit chart review, lab review, study review, order entry, electronic health record documentation, patient  education on the different diagnostic and therapeutic options, counseling and coordination of care, risks and benefits of management, compliance, or risk factor reduction

## 2022-07-22 ENCOUNTER — Other Ambulatory Visit (HOSPITAL_COMMUNITY): Payer: Self-pay

## 2023-05-09 ENCOUNTER — Encounter: Payer: Self-pay | Admitting: Neurology

## 2023-06-17 ENCOUNTER — Other Ambulatory Visit: Payer: Self-pay | Admitting: Neurology

## 2023-06-17 DIAGNOSIS — G43009 Migraine without aura, not intractable, without status migrainosus: Secondary | ICD-10-CM

## 2023-07-05 ENCOUNTER — Other Ambulatory Visit: Payer: Self-pay | Admitting: Neurology

## 2023-07-05 DIAGNOSIS — G43709 Chronic migraine without aura, not intractable, without status migrainosus: Secondary | ICD-10-CM

## 2023-07-05 DIAGNOSIS — G43009 Migraine without aura, not intractable, without status migrainosus: Secondary | ICD-10-CM

## 2023-12-14 ENCOUNTER — Other Ambulatory Visit: Payer: Self-pay | Admitting: Obstetrics and Gynecology

## 2023-12-14 DIAGNOSIS — Z720 Tobacco use: Secondary | ICD-10-CM

## 2023-12-27 ENCOUNTER — Ambulatory Visit
Admission: RE | Admit: 2023-12-27 | Discharge: 2023-12-27 | Disposition: A | Discharge status: Home / Self Care | Source: Ambulatory Visit | Attending: Obstetrics and Gynecology | Admitting: Obstetrics and Gynecology

## 2023-12-27 DIAGNOSIS — Z720 Tobacco use: Secondary | ICD-10-CM

## 2024-01-03 ENCOUNTER — Other Ambulatory Visit: Payer: Self-pay | Admitting: Obstetrics and Gynecology

## 2024-01-03 DIAGNOSIS — I2510 Atherosclerotic heart disease of native coronary artery without angina pectoris: Secondary | ICD-10-CM

## 2024-01-12 ENCOUNTER — Ambulatory Visit
Admission: RE | Admit: 2024-01-12 | Discharge: 2024-01-12 | Disposition: A | Discharge status: Home / Self Care | Source: Ambulatory Visit | Attending: Obstetrics and Gynecology | Admitting: Obstetrics and Gynecology

## 2024-01-12 DIAGNOSIS — I2510 Atherosclerotic heart disease of native coronary artery without angina pectoris: Secondary | ICD-10-CM

## 2024-03-05 ENCOUNTER — Ambulatory Visit: Admitting: Neurology

## 2024-04-09 ENCOUNTER — Ambulatory Visit: Admitting: Neurology
